# Patient Record
Sex: Female | Born: 1990 | Race: White | Hispanic: No | Marital: Married
Health system: Midwestern US, Community
[De-identification: ages and names within clinical notes are randomized; demographics above are authoritative.]

## PROBLEM LIST (undated history)

## (undated) DIAGNOSIS — F419 Anxiety disorder, unspecified: Secondary | ICD-10-CM

## (undated) DIAGNOSIS — E282 Polycystic ovarian syndrome: Secondary | ICD-10-CM

## (undated) DIAGNOSIS — E119 Type 2 diabetes mellitus without complications: Secondary | ICD-10-CM

## (undated) DIAGNOSIS — K219 Gastro-esophageal reflux disease without esophagitis: Secondary | ICD-10-CM

## (undated) DIAGNOSIS — K760 Fatty (change of) liver, not elsewhere classified: Secondary | ICD-10-CM

## (undated) DIAGNOSIS — R748 Abnormal levels of other serum enzymes: Secondary | ICD-10-CM

## (undated) HISTORY — DX: Gastro-esophageal reflux disease without esophagitis: K21.9

## (undated) HISTORY — DX: Anxiety disorder, unspecified: F41.9

## (undated) HISTORY — DX: Abnormal levels of other serum enzymes: R74.8

## (undated) HISTORY — DX: Fatty (change of) liver, not elsewhere classified: K76.0

## (undated) HISTORY — DX: Type 2 diabetes mellitus without complications: E11.9

## (undated) HISTORY — DX: Polycystic ovarian syndrome: E28.2

---

## 2013-02-28 ENCOUNTER — Ambulatory Visit: Payer: Self-pay | Admitting: Nurse Practitioner

## 2013-05-10 ENCOUNTER — Telehealth: Payer: Self-pay | Admitting: Family Medicine

## 2013-05-10 NOTE — Telephone Encounter (Signed)
Patient called requesting information on how to get water out of her ear. States that she bought some [employer] from [place]. Pt advised that she would try that first and then if not resolved would call back for appt

## 2013-05-29 ENCOUNTER — Ambulatory Visit (INDEPENDENT_AMBULATORY_CARE_PROVIDER_SITE_OTHER): Admitting: Physician Assistant

## 2013-05-29 ENCOUNTER — Encounter: Payer: Self-pay | Admitting: Physician Assistant

## 2013-05-29 VITALS — BP 117/73 | HR 102 | Temp 97.8°F | Ht 68.25 in | Wt 280.0 lb

## 2013-05-29 DIAGNOSIS — F411 Generalized anxiety disorder: Secondary | ICD-10-CM

## 2013-05-29 DIAGNOSIS — R1013 Epigastric pain: Secondary | ICD-10-CM

## 2013-05-29 MED ORDER — VENLAFAXINE HCL ER 75 MG PO CP24: 75.0000 mg | ORAL_CAPSULE | Freq: Every day | ORAL | Status: DC

## 2013-05-29 NOTE — Progress Notes (Signed)
Subjective:     Patient ID: Haley Norris, female   DOB: [birth year], 22 y.o.   MRN: [id number]  HPI Pt with a long hx (~14yrs) of abd pain and diarrhea Pt has had a full WU to include colonoscopy, endoscopy , and labs that were all nl She thinks sx may be due to her anxiety She was prev tried on Celexa but stopped due to gaining 60lbs She would like to try Effexor because both of her parents are on this and it has worked well Pt tired of cont abd sx despite changing diet and lifestyle Admits to being very anxious and states abd pain and diarrhea doe not help the situation  Review of Systems  All other systems reviewed and are negative.       Objective:   Physical Exam  Nursing note and vitals reviewed. Spent 15- 20 min with pt face to face Calm at appt Good eye contact and interaction during appt      Assessment:     Anxiety Chronic abd pain    Plan:     Trial of Effexor- start 37.5 daily for 1 week and then increase to 75mg  daily SE reviewed F/U in 3 weeks Cont to manage diet OTC Immodium for sx

## 2013-05-29 NOTE — Patient Instructions (Signed)

## 2013-06-19 ENCOUNTER — Ambulatory Visit: Admitting: Physician Assistant

## 2014-04-07 ENCOUNTER — Telehealth: Payer: Self-pay | Admitting: Nurse Practitioner

## 2014-04-07 NOTE — Telephone Encounter (Signed)
Patient stated that she hurt her shoulder at work. I notified patient that she needs to check with her employer to see if they are going to cover the visit and then give us a call back

## 2014-04-11 ENCOUNTER — Other Ambulatory Visit: Admitting: Obstetrics & Gynecology

## 2014-04-21 ENCOUNTER — Other Ambulatory Visit: Admitting: Obstetrics & Gynecology

## 2014-04-22 ENCOUNTER — Other Ambulatory Visit (HOSPITAL_COMMUNITY)
Admission: RE | Admit: 2014-04-22 | Discharge: 2014-04-22 | Disposition: A | Discharge status: Home / Self Care | Payer: Self-pay | Source: Ambulatory Visit | Attending: Obstetrics & Gynecology | Admitting: Obstetrics & Gynecology

## 2014-04-22 ENCOUNTER — Ambulatory Visit (INDEPENDENT_AMBULATORY_CARE_PROVIDER_SITE_OTHER): Admitting: Obstetrics & Gynecology

## 2014-04-22 ENCOUNTER — Encounter: Payer: Self-pay | Admitting: Obstetrics & Gynecology

## 2014-04-22 VITALS — BP 130/80 | Ht 69.0 in | Wt 284.0 lb

## 2014-04-22 DIAGNOSIS — Z01419 Encounter for gynecological examination (general) (routine) without abnormal findings: Secondary | ICD-10-CM

## 2014-04-22 MED ORDER — NORGESTIMATE-ETH ESTRADIOL 0.25-35 MG-MCG PO TABS: 1.0000 | ORAL_TABLET | Freq: Every day | ORAL | Status: DC

## 2014-04-22 NOTE — Addendum Note (Signed)
Addended by: Gaylyn RongEVANS, Greysen Swanton A on: 04/22/2014 03:10 PM   Modules accepted: Orders

## 2014-04-22 NOTE — Progress Notes (Signed)
Patient ID: Haley Norris, female   DOB: 10/22/1991, 23 y.o.   MRN: [id number] Subjective:     Haley Norris is a 23 y.o. female here for a routine exam.  Patient's last menstrual period was 04/10/2014. No obstetric history on file. Birth Control Method: ocp Menstrual Calendar(currently): regular  Current complaints: none.   Current acute medical issues:  PCOS, reviewed with pt   Recent Gynecologic History Patient's last menstrual period was 04/10/2014. Last Pap: never,   Last mammogram: ,    Past Medical History  Diagnosis Date  . PCOS (polycystic ovarian syndrome)   . Anxiety     History reviewed. No pertinent past surgical history.  OB History   Grav Para Term Preterm Abortions TAB SAB Ect Mult Living                  History   Social History  . Marital Status: Single    Spouse Name: N/A    Number of Children: N/A  . Years of Education: N/A   Social History Main Topics  . Smoking status: Never Smoker   . Smokeless tobacco: Never Used  . Alcohol Use: No  . Drug Use: No  . Sexual Activity: Yes   Other Topics Concern  . None   Social History Narrative  . None    Family History  Problem Relation Age of Onset  . Healthy Mother   . Healthy Father   . Healthy Brother      Review of Systems  Review of Systems  Constitutional: Negative for fever, chills, weight loss, malaise/fatigue and diaphoresis.  HENT: Negative for hearing loss, ear pain, nosebleeds, congestion, sore throat, neck pain, tinnitus and ear discharge.   Eyes: Negative for blurred vision, double vision, photophobia, pain, discharge and redness.  Respiratory: Negative for cough, hemoptysis, sputum production, shortness of breath, wheezing and stridor.   Cardiovascular: Negative for chest pain, palpitations, orthopnea, claudication, leg swelling and PND.  Gastrointestinal: negative for abdominal pain. Negative for heartburn, nausea, vomiting, diarrhea, constipation, blood in stool and  melena.  Genitourinary: Negative for dysuria, urgency, frequency, hematuria and flank pain.  Musculoskeletal: Negative for myalgias, back pain, joint pain and falls.  Skin: Negative for itching and rash.  Neurological: Negative for dizziness, tingling, tremors, sensory change, speech change, focal weakness, seizures, loss of consciousness, weakness and headaches.  Endo/Heme/Allergies: Negative for environmental allergies and polydipsia. Does not bruise/bleed easily.  Psychiatric/Behavioral: Negative for depression, suicidal ideas, hallucinations, memory loss and substance abuse. The patient is not nervous/anxious and does not have insomnia.        Objective:    Physical Exam  Vitals reviewed. Constitutional: She is oriented to person, place, and time. She appears well-developed and well-nourished.  HENT:  Head: Normocephalic and atraumatic.        Right Ear: External ear normal.  Left Ear: External ear normal.  Nose: Nose normal.  Mouth/Throat: Oropharynx is clear and moist.  Eyes: Conjunctivae and EOM are normal. Pupils are equal, round, and reactive to light. Right eye exhibits no discharge. Left eye exhibits no discharge. No scleral icterus.  Neck: Normal range of motion. Neck supple. No tracheal deviation present. No thyromegaly present.  Cardiovascular: Normal rate, regular rhythm, normal heart sounds and intact distal pulses.  Exam reveals no gallop and no friction rub.   No murmur heard. Respiratory: Effort normal and breath sounds normal. No respiratory distress. She has no wheezes. She has no rales. She exhibits no tenderness.  GI: Soft.  Bowel sounds are normal. She exhibits no distension and no mass. There is no tenderness. There is no rebound and no guarding.  Genitourinary:  Breasts no masses skin changes or nipple changes bilaterally      Vulva is normal without lesions Vagina is pink moist without discharge Cervix normal in appearance and pap is done Uterus is normal  size shape and contour Adnexa is negative with normal sized ovaries    Musculoskeletal: Normal range of motion. She exhibits no edema and no tenderness.  Neurological: She is alert and oriented to person, place, and time. She has normal reflexes. She displays normal reflexes. No cranial nerve deficit. She exhibits normal muscle tone. Coordination normal.  Skin: Skin is warm and dry. No rash noted. No erythema. No pallor.  Psychiatric: She has a normal mood and affect. Her behavior is normal. Judgment and thought content normal.       Assessment:    Healthy female exam.    Plan:    Contraception: OCP (estrogen/progesterone). Follow up in: 1 year.

## 2014-04-23 ENCOUNTER — Encounter: Payer: Self-pay | Admitting: Physician Assistant

## 2014-04-23 ENCOUNTER — Ambulatory Visit (INDEPENDENT_AMBULATORY_CARE_PROVIDER_SITE_OTHER): Admitting: Physician Assistant

## 2014-04-23 VITALS — BP 130/81 | HR 88 | Temp 99.3°F | Ht 69.0 in | Wt 283.0 lb

## 2014-04-23 DIAGNOSIS — K589 Irritable bowel syndrome without diarrhea: Secondary | ICD-10-CM

## 2014-04-23 NOTE — Progress Notes (Signed)
Subjective:     Patient ID: Haley PatrickJaden L Robertson, female   DOB: 04/14/1991, 23 y.o.   MRN: [id number]  HPI Pt here due to cont abd pain, early satiety, and diarrhea Prev seen by GI and had colonoscopy which she states was neg Attempt at visualizing the upper GI was unsuccessful since pt became combative Pt informed she had IBS but did not want to start any med due to the SE She would like to return back to the GI for re-eval  Review of Systems  Constitutional: Negative for unexpected weight change.  Gastrointestinal: Positive for nausea, abdominal pain, diarrhea and abdominal distention. Negative for vomiting, blood in stool and anal bleeding.       Objective:   Physical Exam  Vitals reviewed. Cardiovascular: Normal rate, regular rhythm, normal heart sounds and intact distal pulses.   Pulmonary/Chest: Effort normal and breath sounds normal.  Abdominal: Soft. Bowel sounds are normal. She exhibits no distension and no mass. There is no tenderness.       Assessment:     IBS    Plan:     Pt has large intake of diary product- mainly milk Would like her to decrease this and caff intake She is a non smoker and does not drink alcohol OTC Immodium for sx Will set up referral

## 2014-04-29 ENCOUNTER — Telehealth: Payer: Self-pay | Admitting: Physician Assistant

## 2014-10-15 ENCOUNTER — Ambulatory Visit: Admitting: Family Medicine

## 2014-12-12 ENCOUNTER — Encounter: Payer: Self-pay | Admitting: Family Medicine

## 2014-12-12 ENCOUNTER — Ambulatory Visit (INDEPENDENT_AMBULATORY_CARE_PROVIDER_SITE_OTHER): Payer: 59

## 2014-12-12 ENCOUNTER — Ambulatory Visit (INDEPENDENT_AMBULATORY_CARE_PROVIDER_SITE_OTHER): Payer: 59 | Admitting: Family Medicine

## 2014-12-12 ENCOUNTER — Encounter (INDEPENDENT_AMBULATORY_CARE_PROVIDER_SITE_OTHER): Payer: Self-pay

## 2014-12-12 VITALS — BP 148/84 | HR 124 | Temp 97.6°F | Ht 69.0 in | Wt 290.0 lb

## 2014-12-12 DIAGNOSIS — R1012 Left upper quadrant pain: Secondary | ICD-10-CM

## 2014-12-12 DIAGNOSIS — R5383 Other fatigue: Secondary | ICD-10-CM

## 2014-12-12 DIAGNOSIS — K2100 Gastro-esophageal reflux disease with esophagitis, without bleeding: Secondary | ICD-10-CM

## 2014-12-12 DIAGNOSIS — R197 Diarrhea, unspecified: Secondary | ICD-10-CM

## 2014-12-12 DIAGNOSIS — K5909 Other constipation: Secondary | ICD-10-CM

## 2014-12-12 LAB — POCT CBC
Granulocyte percent: 62.5 %G (ref 37–80)
HCT, POC: 46.9 % (ref 37.7–47.9)
Hemoglobin: 14.3 g/dL (ref 12.2–16.2)
Lymph, poc: 1.7 (ref 0.6–3.4)
MCH, POC: 25.1 pg — AB (ref 27–31.2)
MCHC: 30.5 g/dL — AB (ref 31.8–35.4)
MCV: 82.1 fL (ref 80–97)
MPV: 6.9 fL (ref 0–99.8)
POC Granulocyte: 3.5 (ref 2–6.9)
POC LYMPH PERCENT: 30.2 %L (ref 10–50)
Platelet Count, POC: 218 10*3/uL (ref 142–424)
RBC: 5.7 M/uL — AB (ref 4.04–5.48)
RDW, POC: 14.5 %
WBC: 5.6 10*3/uL (ref 4.6–10.2)

## 2014-12-12 MED ORDER — POLYETHYLENE GLYCOL 3350 17 GM/SCOOP PO POWD: 17.0000 g | Freq: Two times a day (BID) | ORAL | Status: DC | PRN

## 2014-12-12 MED ORDER — OMEPRAZOLE 20 MG PO CPDR: 20.0000 mg | DELAYED_RELEASE_CAPSULE | Freq: Two times a day (BID) | ORAL | Status: DC

## 2014-12-12 NOTE — Addendum Note (Signed)
Addended by: Deatra CanterXFORD, WILLIAM J on: 12/12/2014 04:36 PM   Modules accepted: Orders

## 2014-12-12 NOTE — Progress Notes (Signed)
Subjective:    Patient ID: Haley Norris, female    DOB: [birth year], 24 y.o.   MRN: [id number]  HPI Patient is c/o diarrhea and 5 bowel movements for last 4 years.  She has seen GI And was told she has IBS.  She states she has early satiety.  She has LUQ abdominal pain.  She has problems with GERD sx's which she describes as Severe.  She saw GI 3 or 4 years ago.  She states she eats very little but is still gaining weight.   Review of Systems  Constitutional: Negative for fever.  HENT: Negative for ear pain.   Eyes: Negative for discharge.  Respiratory: Negative for cough.   Cardiovascular: Negative for chest pain.  Gastrointestinal: Negative for abdominal distention.  Endocrine: Negative for polyuria.  Genitourinary: Negative for difficulty urinating.  Musculoskeletal: Negative for gait problem and neck pain.  Skin: Negative for color change and rash.  Neurological: Negative for speech difficulty and headaches.  Psychiatric/Behavioral: Negative for agitation.       Objective:    BP 148/84 mmHg  Pulse 124  Temp(Src) 97.6 F (36.4 C) (Oral)  Ht 5' 9" (1.753 m)  Wt 290 lb (131.543 kg)  BMI 42.81 kg/m2 Physical Exam  Constitutional: She is oriented to person, place, and time. She appears well-developed and well-nourished.  HENT:  Head: Normocephalic and atraumatic.  Mouth/Throat: Oropharynx is clear and moist.  Eyes: Pupils are equal, round, and reactive to light.  Neck: Normal range of motion. Neck supple.  Cardiovascular: Normal rate and regular rhythm.   No murmur heard. Pulmonary/Chest: Effort normal and breath sounds normal.  Abdominal: Soft. Bowel sounds are normal. There is tenderness.  TTP LUQ of abdomen  Neurological: She is alert and oriented to person, place, and time.  Skin: Skin is warm and dry.  Psychiatric: She has a normal mood and affect.          Assessment & Plan:     ICD-9-CM ICD-10-CM   1. Gastroesophageal reflux disease with esophagitis  530.11 K21.0 omeprazole (PRILOSEC) 20 MG capsule     Ambulatory referral to Gastroenterology  2. Left upper quadrant pain 789.02 R10.12 DG Abd 1 View     POCT CBC     CMP14+EGFR     Celiac Panel     Ambulatory referral to Gastroenterology  3. Diarrhea 787.91 R19.7 Vitamin B12     Celiac Panel     Ambulatory referral to Gastroenterology  4. Other fatigue 780.79 R53.83 Thyroid Panel With TSH     Vitamin B12     No Follow-up on file.  Lysbeth Penner FNP

## 2014-12-12 NOTE — Addendum Note (Signed)
Addended by: Prescott GumLAND, Robinette Esters M on: 12/12/2014 04:28 PM   Modules accepted: Orders

## 2014-12-12 NOTE — Addendum Note (Signed)
Addended by: Prescott GumLAND, Broughton Eppinger M on: 12/12/2014 04:29 PM   Modules accepted: Orders

## 2014-12-16 LAB — CMP14+EGFR
ALT: 62 IU/L — ABNORMAL HIGH (ref 0–32)
AST: 132 IU/L — ABNORMAL HIGH (ref 0–40)
Albumin/Globulin Ratio: 1.6 (ref 1.1–2.5)
Albumin: 4.5 g/dL (ref 3.5–5.5)
Alkaline Phosphatase: 80 IU/L (ref 39–117)
BUN/Creatinine Ratio: 12 (ref 8–20)
BUN: 7 mg/dL (ref 6–20)
CO2: 22 mmol/L (ref 18–29)
Calcium: 9.8 mg/dL (ref 8.7–10.2)
Chloride: 98 mmol/L (ref 97–108)
Creatinine, Ser: 0.59 mg/dL (ref 0.57–1.00)
GFR calc Af Amer: 152 mL/min/{1.73_m2} (ref >59)
GFR calc non Af Amer: 131 mL/min/{1.73_m2} (ref >59)
Globulin, Total: 2.8 g/dL (ref 1.5–4.5)
Glucose: 109 mg/dL — ABNORMAL HIGH (ref 65–99)
Potassium: 4.8 mmol/L (ref 3.5–5.2)
Sodium: 140 mmol/L (ref 134–144)
Total Bilirubin: <0.2 mg/dL (ref 0.0–1.2)
Total Protein: 7.3 g/dL (ref 6.0–8.5)

## 2014-12-16 LAB — THYROID PANEL WITH TSH
Free Thyroxine Index: <1.9 (ref 1.2–4.9)
T3 Uptake Ratio: <15 % — ABNORMAL LOW (ref 24–39)
T4, Total: 12.4 ug/dL — ABNORMAL HIGH (ref 4.5–12.0)
TSH: 3.53 u[IU]/mL (ref 0.450–4.500)

## 2014-12-16 LAB — CELIAC PANEL 10
Antigliadin Abs, IgA: 3 units (ref 0–19)
Endomysial IgA: NEGATIVE
Gliadin IgG: 2 units (ref 0–19)
IgA/Immunoglobulin A, Serum: 105 mg/dL (ref 91–414)
Tissue Transglut Ab: <2 U/mL (ref 0–5)
Transglutaminase IgA: <2 U/mL (ref 0–3)

## 2014-12-16 LAB — VITAMIN B12: Vitamin B-12: 463 pg/mL (ref 211–946)

## 2014-12-24 ENCOUNTER — Encounter (INDEPENDENT_AMBULATORY_CARE_PROVIDER_SITE_OTHER): Payer: Self-pay | Admitting: *Deleted

## 2015-01-01 ENCOUNTER — Ambulatory Visit: Payer: 59 | Admitting: Family Medicine

## 2015-01-22 ENCOUNTER — Ambulatory Visit (INDEPENDENT_AMBULATORY_CARE_PROVIDER_SITE_OTHER): Payer: 59 | Admitting: Internal Medicine

## 2015-01-22 ENCOUNTER — Encounter (INDEPENDENT_AMBULATORY_CARE_PROVIDER_SITE_OTHER): Payer: Self-pay | Admitting: *Deleted

## 2015-01-22 ENCOUNTER — Other Ambulatory Visit (INDEPENDENT_AMBULATORY_CARE_PROVIDER_SITE_OTHER): Payer: Self-pay | Admitting: *Deleted

## 2015-01-22 ENCOUNTER — Encounter (INDEPENDENT_AMBULATORY_CARE_PROVIDER_SITE_OTHER): Payer: Self-pay | Admitting: Internal Medicine

## 2015-01-22 VITALS — BP 112/82 | HR 116 | Temp 98.1°F | Ht 69.0 in | Wt 290.0 lb

## 2015-01-22 DIAGNOSIS — R1013 Epigastric pain: Secondary | ICD-10-CM

## 2015-01-22 DIAGNOSIS — R748 Abnormal levels of other serum enzymes: Secondary | ICD-10-CM

## 2015-01-22 DIAGNOSIS — K589 Irritable bowel syndrome without diarrhea: Secondary | ICD-10-CM

## 2015-01-22 DIAGNOSIS — R140 Abdominal distension (gaseous): Secondary | ICD-10-CM

## 2015-01-22 DIAGNOSIS — G8929 Other chronic pain: Secondary | ICD-10-CM

## 2015-01-22 LAB — HEPATIC FUNCTION PANEL
ALBUMIN: 4.4 g/dL (ref 3.5–5.2)
ALT: 95 U/L — ABNORMAL HIGH (ref 0–35)
AST: 128 U/L — AB (ref 0–37)
Alkaline Phosphatase: 98 U/L (ref 39–117)
Total Bilirubin: 0.3 mg/dL (ref 0.2–1.2)
Total Protein: 7.4 g/dL (ref 6.0–8.3)

## 2015-01-22 MED ORDER — DICYCLOMINE HCL 10 MG PO CAPS: 10.0000 mg | ORAL_CAPSULE | Freq: Three times a day (TID) | ORAL | Status: DC

## 2015-01-22 NOTE — Progress Notes (Addendum)
Subjective:    Patient ID: Haley SillsJaden Willetts, female    DOB: 07/13/1991, 24 y.o.   MRN: [id number]  HPI Referred by Dr. Purcell Nailsxford at [facility]. She tells me in the lat 1 1/2 years, she has epigastric pain.   fullness in her upper abdomen. She tells me she has acid reflux about 2 times a week and takes PPI on an as needed basis.  No dysphagia.   She has 5-6 loose stools a day. She has BMs almost immediately after eating.. She says greasy foods bother her. No fast foods in the past months. Her appetite is good. She does have early satiety but then becomes  hungry again quickly.  [place] symptoms which were normal. Patient cannot remember who she saw. EGD was not perfomed because she became combative. NO ASA 12/12/2014 AST 132, ALT 62.   12/08/2011 EGD Dr. Jeani HawkingPatrick Hung: Normal EGD. Biopsy: Mild peptic type changes. Gastric fovelolar metaplasia identified. No evidence of celiac disease  12/08/2011 Colonoscopy with Biopsy: Dr. Elnoria HowardHung: Normal colon. Medium hemorrhoids.  Diarrhea (watery). Epigastric pain, nausea,. Biopsy: unremarkable colonic mucosa. No evidence of lymphocytic or collagenous colitis.   CBC    Component Value Date/Time   WBC 5.6 12/12/2014 1642   RBC 5.7* 12/12/2014 1642   HGB 14.3 12/12/2014 1642   HCT 46.9 12/12/2014 1642   MCV 82.1 12/12/2014 1642   MCH 25.1* 12/12/2014 1642   MCHC 30.5* 12/12/2014 1642    CMP Latest Ref Rng 12/12/2014  Glucose 65 - 99 mg/dL 409(W109(H)  BUN 6 - 20 mg/dL 7  Creatinine 1.190.57 - 1.471.00 mg/dL 8.290.59  Sodium 562134 - 130144 mmol/L 140  Potassium 3.5 - 5.2 mmol/L 4.8  Chloride 97 - 108 mmol/L 98  CO2 18 - 29 mmol/L 22  Calcium 8.7 - 10.2 mg/dL 9.8  Total Protein 6.0 - 8.5 g/dL 7.3  Albumin 3.5 - 5.5 g/dL 4.5  Total Bilirubin 0.0 - 1.2 mg/dL <8.6<0.2  Alkaline Phos 39 - 117 IU/L 80  AST 0 - 40 IU/L 132(H)  ALT 0 - 32 IU/L 62(H)   .ts   Review of Systems Past Medical History  Diagnosis  Date  . PCOS (polycystic ovarian syndrome)   . Anxiety   . GERD (gastroesophageal reflux disease)     No past surgical history on file.  No Known Allergies  Current Outpatient Prescriptions on File Prior to Visit  Medication Sig Dispense Refill  . norgestimate-ethinyl estradiol (ORTHO-CYCLEN,SPRINTEC,PREVIFEM) 0.25-35 MG-MCG tablet Take 1 tablet by mouth daily. 1 Package 12  . omeprazole (PRILOSEC) 20 MG capsule Take 1 capsule (20 mg total) by mouth 2 (two) times daily before a meal. (Patient taking differently: Take 20 mg by mouth as needed. ) 60 capsule 6   No current facility-administered medications on file prior to visit.        Objective:   Physical Exam Pulse 116, B/P 112/82 Filed Vitals:   01/22/15 1005  Height: 5\' 9"  (1.753 m)  Weight: 290 lb (131.543 kg)     Filed Vitals:   01/22/15 1005  Height: 5\' 9"  (1.753 m)  Weight: 290 lb (131.543 kg)    Filed Vitals:   01/22/15 1005  Height: 5\' 9"  (1.753 m)  Weight: 290 lb (131.543 kg)   Alert and oriented. Skin warm and dry. Oral mucosa is moist.   . Sclera anicteric, conjunctivae is pink. Thyroid not enlarged. No cervical lymphadenopathy. Lungs clear. Heart regular  rate and rhythm.  Abdomen is soft. Bowel sounds are positive. No hepatomegaly. No abdominal masses felt. She has epigastric tenderness.  No edema to lower extremities.  Morbidly obese        Assessment & Plan:   Epigastric tenderness: PUD needs to be ruled out. Prilosec  BID. EGD.The risks and benefits such as perforation, bleeding, and infection were reviewed with the patient and is agreeable. Dicyclomine  TID for possible IBS. I have asked patient and her mother to try and find out who did her colonoscopy.  Elevated liver enzymes: am going to repeat a hepatic function on her.

## 2015-01-22 NOTE — Patient Instructions (Signed)
EGD. The risks and benefits such as perforation, bleeding, and infection were reviewed with the patient and is agreeable. 

## 2015-01-23 ENCOUNTER — Other Ambulatory Visit (INDEPENDENT_AMBULATORY_CARE_PROVIDER_SITE_OTHER): Payer: Self-pay | Admitting: Internal Medicine

## 2015-01-23 ENCOUNTER — Telehealth (INDEPENDENT_AMBULATORY_CARE_PROVIDER_SITE_OTHER): Payer: Self-pay | Admitting: Internal Medicine

## 2015-01-23 ENCOUNTER — Encounter (INDEPENDENT_AMBULATORY_CARE_PROVIDER_SITE_OTHER): Payer: Self-pay | Admitting: Internal Medicine

## 2015-01-23 DIAGNOSIS — R748 Abnormal levels of other serum enzymes: Secondary | ICD-10-CM

## 2015-01-23 NOTE — Telephone Encounter (Signed)
Am going to order labs to rule out autoimmune process

## 2015-01-27 LAB — HEPATITIS C ANTIBODY: HCV AB: NEGATIVE

## 2015-01-27 LAB — SEDIMENTATION RATE: SED RATE: 34 mm/h — AB (ref 0–20)

## 2015-01-27 LAB — FERRITIN: Ferritin: 114 ng/mL (ref 10–291)

## 2015-01-27 LAB — ANTI-SMOOTH MUSCLE ANTIBODY, IGG: SMOOTH MUSCLE AB: 2 U (ref <20)

## 2015-01-28 ENCOUNTER — Ambulatory Visit (HOSPITAL_COMMUNITY): Payer: 59

## 2015-01-28 LAB — MITOCHONDRIAL ANTIBODIES: Mitochondrial M2 Ab, IgG: 0.45 (ref <0.91)

## 2015-01-28 LAB — ANA: Anti Nuclear Antibody(ANA): NEGATIVE

## 2015-02-02 ENCOUNTER — Ambulatory Visit (HOSPITAL_COMMUNITY)
Admission: RE | Admit: 2015-02-02 | Discharge: 2015-02-02 | Disposition: A | Discharge status: Home / Self Care | Payer: 59 | Source: Ambulatory Visit | Attending: Internal Medicine | Admitting: Internal Medicine

## 2015-02-02 DIAGNOSIS — R748 Abnormal levels of other serum enzymes: Secondary | ICD-10-CM

## 2015-02-04 ENCOUNTER — Encounter (INDEPENDENT_AMBULATORY_CARE_PROVIDER_SITE_OTHER): Payer: Self-pay | Admitting: *Deleted

## 2015-02-04 ENCOUNTER — Telehealth (INDEPENDENT_AMBULATORY_CARE_PROVIDER_SITE_OTHER): Payer: Self-pay | Admitting: *Deleted

## 2015-02-04 DIAGNOSIS — K589 Irritable bowel syndrome without diarrhea: Secondary | ICD-10-CM

## 2015-02-04 DIAGNOSIS — R1013 Epigastric pain: Secondary | ICD-10-CM

## 2015-02-04 NOTE — Telephone Encounter (Signed)
.  Per Delrae Renderri Setzer,NP patient to have Hepatic Profile in 4 weeks.

## 2015-02-04 NOTE — Telephone Encounter (Signed)
Lab is noted for 03/04/15. A letter was sent to patient as a reminder.

## 2015-02-04 NOTE — Telephone Encounter (Signed)
Tammy, hepatic function in 4 weeks.  Lupita LeashDonna, OV in 6 weeks.

## 2015-02-04 NOTE — Telephone Encounter (Signed)
Patient would like results of US, please call her home#

## 2015-02-06 NOTE — Telephone Encounter (Signed)
Apt has been scheduled for 03/19/15 with Dorene Arerri Setzer, NP.

## 2015-02-12 ENCOUNTER — Encounter: Admission: RE | Payer: Self-pay | Source: Ambulatory Visit

## 2015-02-12 ENCOUNTER — Ambulatory Visit: Admission: RE | Admit: 2015-02-12 | Payer: 59 | Source: Ambulatory Visit | Admitting: Internal Medicine

## 2015-02-12 SURGERY — EGD (ESOPHAGOGASTRODUODENOSCOPY)
Anesthesia: Moderate Sedation

## 2015-02-24 ENCOUNTER — Encounter (INDEPENDENT_AMBULATORY_CARE_PROVIDER_SITE_OTHER): Payer: Self-pay

## 2015-03-06 LAB — HEPATIC FUNCTION PANEL
ALT: 104 U/L — ABNORMAL HIGH (ref 0–35)
AST: 204 U/L — ABNORMAL HIGH (ref 0–37)
Albumin: 4.4 g/dL (ref 3.5–5.2)
Alkaline Phosphatase: 96 U/L (ref 39–117)
Bilirubin, Direct: 0.1 mg/dL (ref 0.0–0.3)
Indirect Bilirubin: 0.2 mg/dL (ref 0.2–1.2)
TOTAL PROTEIN: 7.5 g/dL (ref 6.0–8.3)
Total Bilirubin: 0.3 mg/dL (ref 0.2–1.2)

## 2015-03-19 ENCOUNTER — Ambulatory Visit (INDEPENDENT_AMBULATORY_CARE_PROVIDER_SITE_OTHER): Payer: 59 | Admitting: Internal Medicine

## 2015-03-19 ENCOUNTER — Telehealth (INDEPENDENT_AMBULATORY_CARE_PROVIDER_SITE_OTHER): Payer: Self-pay | Admitting: *Deleted

## 2015-03-19 ENCOUNTER — Telehealth (INDEPENDENT_AMBULATORY_CARE_PROVIDER_SITE_OTHER): Payer: Self-pay | Admitting: Internal Medicine

## 2015-03-19 DIAGNOSIS — R748 Abnormal levels of other serum enzymes: Secondary | ICD-10-CM

## 2015-03-19 DIAGNOSIS — K589 Irritable bowel syndrome without diarrhea: Secondary | ICD-10-CM

## 2015-03-19 NOTE — Telephone Encounter (Signed)
.  Per Delrae Renderri Setzer,NP patient to have las.

## 2015-03-19 NOTE — Telephone Encounter (Signed)
OV today 

## 2015-05-06 ENCOUNTER — Encounter (INDEPENDENT_AMBULATORY_CARE_PROVIDER_SITE_OTHER): Payer: Self-pay | Admitting: *Deleted

## 2015-05-12 ENCOUNTER — Ambulatory Visit (INDEPENDENT_AMBULATORY_CARE_PROVIDER_SITE_OTHER): Payer: 59 | Admitting: Internal Medicine

## 2015-05-18 ENCOUNTER — Telehealth (INDEPENDENT_AMBULATORY_CARE_PROVIDER_SITE_OTHER): Payer: Self-pay | Admitting: Internal Medicine

## 2015-05-18 ENCOUNTER — Encounter (INDEPENDENT_AMBULATORY_CARE_PROVIDER_SITE_OTHER): Payer: Self-pay | Admitting: Internal Medicine

## 2015-05-18 ENCOUNTER — Telehealth (INDEPENDENT_AMBULATORY_CARE_PROVIDER_SITE_OTHER): Payer: Self-pay | Admitting: *Deleted

## 2015-05-18 ENCOUNTER — Ambulatory Visit (INDEPENDENT_AMBULATORY_CARE_PROVIDER_SITE_OTHER): Payer: 59 | Admitting: Internal Medicine

## 2015-05-18 VITALS — BP 130/84 | HR 68 | Temp 97.7°F | Ht 69.0 in | Wt 290.9 lb

## 2015-05-18 DIAGNOSIS — R748 Abnormal levels of other serum enzymes: Secondary | ICD-10-CM

## 2015-05-18 NOTE — Progress Notes (Addendum)
   Subjective:    Patient ID: Haley Norris, female    DOB: 07-08-1991, 24 y.o.   MRN: [id number]  HPI  Here today for f/u of her elevated transaminase. She was last seen in February of this year. She has missed a few appts. Appetite is good. No weight loss. No abdominal pain. She usually has a BM after each meal and most of her stools are loose.  She is not exercising. 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 Mother is present in room.  BC since 2012 for poly cystic ovary disease Weight in February 290. Today her weight 290.9 She has received the Hep B vaccine in high school.    Hepatic Function Latest Ref Rng 03/05/2015 01/22/2015 12/12/2014  Total Protein 6.0 - 8.3 g/dL 7.5 7.4 7.3  Albumin 3.5 - 5.2 g/dL 4.4 4.4 -  AST 0 - 37 U/L 204(H) 128(H) 132(H)  ALT 0 - 35 U/L 104(H) 95(H) 62(H)  Alk Phosphatase 39 - 117 U/L 96 98 80  Total Bilirubin 0.2 - 1.2 mg/dL 0.3 0.3 <0.2  Bilirubin, Direct 0.0 - 0.3 mg/dL 0.1 <0.1 -      02/02/2015 US abdomen: IMPRESSION: There is no acute hepatobiliary abnormality. There is mild enlargement of the liver and spleen which may be related to the patient's body habitus. In addition increased hepatic echotexture is consistent with fatty infiltration.   12/2014 vitamin B12 463 01/26/2015 Mitochondrial antibody: normal. Ferritin 114, Vitamin B12 463, Hep C antibody negative, ANA negative, SMA 2, sedrate 44   12/08/2011 EGD Dr. Carol Ada: Normal EGD. Biopsy: Mild peptic type changes. Gastric fovelolar metaplasia identified. No evidence of celiac disease  12/08/2011 Colonoscopy with Biopsy: Dr. Benson Norway: Normal colon. Medium hemorrhoids.   Diarrhea (watery). Epigastric pain, nausea,. Biopsy: unremarkable colonic mucosa. No evidence of lymphocytic or collagenous colitis.      Review of Systems Past Medical History  Diagnosis Date  . PCOS (polycystic ovarian syndrome)   . Anxiety   . GERD (gastroesophageal reflux disease)   . Fatty liver   . Elevated liver  enzymes     No past surgical history on file.  No Known Allergies  Current Outpatient Prescriptions on File Prior to Visit  Medication Sig Dispense Refill  . norgestimate-ethinyl estradiol (ORTHO-CYCLEN,SPRINTEC,PREVIFEM) 0.25-35 MG-MCG tablet Take 1 tablet by mouth daily. 1 Package 12  . omeprazole (PRILOSEC) 20 MG capsule Take 1 capsule (20 mg total) by mouth 2 (two) times daily before a meal. 60 capsule 6   No current facility-administered medications on file prior to visit.  Married, no children,.      Objective:   Physical Exam  Blood pressure 130/84, pulse 68, temperature 97.7 F (36.5 C), height $RemoveBe'5\' 9"'nsFBLKzlm$  (1.753 m), weight 290 lb 14.4 oz (131.951 kg).  Alert and oriented. Skin warm and dry. Oral mucosa is moist.   . Sclera anicteric, conjunctivae is pink. Thyroid not enlarged. No cervical lymphadenopathy. Lungs clear. Heart regular rate and rhythm.  Abdomen is soft. Bowel sounds are positive. No hepatomegaly. No abdominal masses felt. No tenderness.  No edema to lower extremities.  Extremely obese.      Assessment & Plan:  Elevated liver enzymes, Fatty liver. Patient must diet and exercise. Ceruloplasmin, CBC with diff, Hepatic function, Alpha 1 antitrypsin. At some point patient may need a liver biopsy. I will discuss with Dr. Laural Golden first. OV in 2 months

## 2015-05-18 NOTE — Telephone Encounter (Signed)
Patient has OV today

## 2015-05-18 NOTE — Telephone Encounter (Signed)
.  Per Delrae Rend patient will need this lab.

## 2015-05-18 NOTE — Patient Instructions (Signed)
Labs today. OV in 6 weeks. 

## 2015-05-19 LAB — HEPATIC FUNCTION PANEL
ALBUMIN: 4.4 g/dL (ref 3.5–5.2)
ALK PHOS: 92 U/L (ref 39–117)
ALT: 79 U/L — ABNORMAL HIGH (ref 0–35)
AST: 142 U/L — AB (ref 0–37)
BILIRUBIN INDIRECT: 0.2 mg/dL (ref 0.2–1.2)
Bilirubin, Direct: 0.1 mg/dL (ref 0.0–0.3)
Total Bilirubin: 0.3 mg/dL (ref 0.2–1.2)
Total Protein: 7.2 g/dL (ref 6.0–8.3)

## 2015-05-19 LAB — CBC WITH DIFFERENTIAL/PLATELET
BASOS ABS: 0 10*3/uL (ref 0.0–0.1)
Basophils Relative: 0 % (ref 0–1)
EOS ABS: 0.2 10*3/uL (ref 0.0–0.7)
EOS PCT: 1 % (ref 0–5)
HCT: 37.2 % (ref 36.0–46.0)
HEMOGLOBIN: 11.9 g/dL — AB (ref 12.0–15.0)
LYMPHS ABS: 4.6 10*3/uL — AB (ref 0.7–4.0)
Lymphocytes Relative: 27 % (ref 12–46)
MCH: 26.2 pg (ref 26.0–34.0)
MCHC: 32 g/dL (ref 30.0–36.0)
MCV: 81.8 fL (ref 78.0–100.0)
MPV: 9.4 fL (ref 8.6–12.4)
Monocytes Absolute: 0.9 10*3/uL (ref 0.1–1.0)
Monocytes Relative: 5 % (ref 3–12)
Neutro Abs: 11.4 10*3/uL — ABNORMAL HIGH (ref 1.7–7.7)
Neutrophils Relative %: 67 % (ref 43–77)
PLATELETS: 563 10*3/uL — AB (ref 150–400)
RBC: 4.55 MIL/uL (ref 3.87–5.11)
RDW: 15.1 % (ref 11.5–15.5)
WBC: 17 10*3/uL — ABNORMAL HIGH (ref 4.0–10.5)

## 2015-05-20 LAB — ALPHA-1-ANTITRYPSIN: A-1 Antitrypsin, Ser: 242 mg/dL — ABNORMAL HIGH (ref 83–199)

## 2015-05-20 LAB — CERULOPLASMIN: Ceruloplasmin: 50 mg/dL (ref 18–53)

## 2015-05-21 ENCOUNTER — Telehealth (INDEPENDENT_AMBULATORY_CARE_PROVIDER_SITE_OTHER): Payer: Self-pay | Admitting: Internal Medicine

## 2015-05-21 DIAGNOSIS — R748 Abnormal levels of other serum enzymes: Secondary | ICD-10-CM

## 2015-05-21 NOTE — Telephone Encounter (Signed)
Patient will have labs drawn tomorrow.

## 2015-05-22 ENCOUNTER — Telehealth (INDEPENDENT_AMBULATORY_CARE_PROVIDER_SITE_OTHER): Payer: Self-pay | Admitting: Internal Medicine

## 2015-05-22 DIAGNOSIS — D72829 Elevated white blood cell count, unspecified: Secondary | ICD-10-CM

## 2015-05-22 NOTE — Telephone Encounter (Signed)
CBC ordered 

## 2015-06-25 ENCOUNTER — Encounter (INDEPENDENT_AMBULATORY_CARE_PROVIDER_SITE_OTHER): Payer: Self-pay | Admitting: *Deleted

## 2015-07-14 ENCOUNTER — Ambulatory Visit (INDEPENDENT_AMBULATORY_CARE_PROVIDER_SITE_OTHER): Payer: 59 | Admitting: Internal Medicine

## 2015-07-14 ENCOUNTER — Encounter (INDEPENDENT_AMBULATORY_CARE_PROVIDER_SITE_OTHER): Payer: Self-pay | Admitting: Internal Medicine

## 2015-07-14 ENCOUNTER — Telehealth (INDEPENDENT_AMBULATORY_CARE_PROVIDER_SITE_OTHER): Payer: Self-pay | Admitting: *Deleted

## 2015-07-14 VITALS — BP 132/80 | HR 80 | Temp 98.1°F | Ht 69.0 in | Wt 276.1 lb

## 2015-07-14 DIAGNOSIS — R7989 Other specified abnormal findings of blood chemistry: Secondary | ICD-10-CM

## 2015-07-14 DIAGNOSIS — R945 Abnormal results of liver function studies: Principal | ICD-10-CM

## 2015-07-14 DIAGNOSIS — R748 Abnormal levels of other serum enzymes: Secondary | ICD-10-CM

## 2015-07-14 NOTE — Patient Instructions (Signed)
Labs. OV in 6 months. Diet and exercise.  

## 2015-07-14 NOTE — Progress Notes (Signed)
Subjective:    Patient ID: Haley Norris, female    DOB: [birth year], 24 y.o.   MRN: [id number]  HPI Here today for f/u. She was last seen in June for elevated liver enzymes. She has received Hepatitis B vaccine.  She has been on [facility] since 2012 for poly cystic ovary disease.  Her appetite is good. She has changed her eating habits. She has lost 13 pounds since her last visit which was intentional. No acid reflux. Controlled with Omeprazole.  She is working part time as a Lawyer. Her BMs are normal. No melena or BRRB.  Hepatic Function Panel     Component Value Date/Time   PROT 7.2 05/18/2015 1351   PROT 7.3 12/12/2014 1630   ALBUMIN 4.4 05/18/2015 1351   AST 142* 05/18/2015 1351   ALT 79* 05/18/2015 1351   ALKPHOS 92 05/18/2015 1351   BILITOT 0.3 05/18/2015 1351   BILIDIR 0.1 05/18/2015 1351   IBILI 0.2 05/18/2015 1351        Hepatic Function Latest Ref Rng 03/05/2015 01/22/2015 12/12/2014  Total Protein 6.0 - 8.3 g/dL 7.5 7.4 7.3  Albumin 3.5 - 5.2 g/dL 4.4 4.4 -  AST 0 - 37 U/L 204(H) 128(H) 132(H)  ALT 0 - 35 U/L 104(H) 95(H) 62(H)  Alk Phosphatase 39 - 117 U/L 96 98 80  Total Bilirubin 0.2 - 1.2 mg/dL 0.3 0.3 <5.2  Bilirubin, Direct 0.0 - 0.3 mg/dL 0.1 <5.1 -      08/22/6377 US abdomen: IMPRESSION: There is no acute hepatobiliary abnormality. There is mild enlargement of the liver and spleen which may be related to the patient's body habitus. In addition increased hepatic echotexture is consistent with fatty infiltration.  12/2014 vitamin B12 463 01/26/2015 Mitochondrial antibody: normal. Ferritin 114, Vitamin B12 463, Hep C antibody negative, ANA negative, SMA 2, sedrate 44   12/08/2011 EGD Dr. Jeani Hawking: Normal EGD. Biopsy: Mild peptic type changes. Gastric fovelolar metaplasia identified. No evidence of celiac disease  12/08/2011 Colonoscopy with Biopsy: Dr. Elnoria Howard: Normal colon. Medium hemorrhoids.  Diarrhea (watery).  Epigastric pain, nausea,. Biopsy: unremarkable colonic mucosa. No evidence of lymphocytic or collagenous colitis.            Review of Systems Past Medical History  Diagnosis Date  . PCOS (polycystic ovarian syndrome)   . Anxiety   . GERD (gastroesophageal reflux disease)   . Fatty liver   . Elevated liver enzymes     No past surgical history on file.  No Known Allergies  Current Outpatient Prescriptions on File Prior to Visit  Medication Sig Dispense Refill  . norgestimate-ethinyl estradiol (ORTHO-CYCLEN,SPRINTEC,PREVIFEM) 0.25-35 MG-MCG tablet Take 1 tablet by mouth daily. 1 Package 12  . omeprazole (PRILOSEC) 20 MG capsule Take 1 capsule (20 mg total) by mouth 2 (two) times daily before a meal. 60 capsule 6   No current facility-administered medications on file prior to visit.        Objective:   Physical ExamBlood pressure 132/80, pulse 80, temperature 98.1 F (36.7 C), height 5\' 9"  (1.753 m), weight 276 lb 1.6 oz (125.238 kg). Alert and oriented. Skin warm and dry. Oral mucosa is moist.   . Sclera anicteric, conjunctivae is pink. Thyroid not enlarged. No cervical lymphadenopathy. Lungs clear. Heart regular rate and rhythm.  Abdomen is soft. Bowel sounds are positive. No hepatomegaly. No abdominal masses felt. No tenderness.  No edema to lower extremities.          Assessment & Plan:  Elevated liver enzymes.  Hepatic function, Sedrate , Alpha 1 antitrypsin, Hep B antibody, Hep B surface antigen, CBC. OV in 6 months

## 2015-07-14 NOTE — Telephone Encounter (Signed)
.  Per [name] Setzer,NP patient to have lab. 

## 2015-07-15 LAB — CBC WITH DIFFERENTIAL/PLATELET
Basophils Absolute: 0 10*3/uL (ref 0.0–0.1)
Basophils Relative: 0 % (ref 0–1)
EOS ABS: 0.1 10*3/uL (ref 0.0–0.7)
Eosinophils Relative: 1 % (ref 0–5)
HCT: 36.9 % (ref 36.0–46.0)
Hemoglobin: 12 g/dL (ref 12.0–15.0)
LYMPHS ABS: 4.9 10*3/uL — AB (ref 0.7–4.0)
Lymphocytes Relative: 36 % (ref 12–46)
MCH: 25.5 pg — ABNORMAL LOW (ref 26.0–34.0)
MCHC: 32.5 g/dL (ref 30.0–36.0)
MCV: 78.5 fL (ref 78.0–100.0)
MPV: 10.1 fL (ref 8.6–12.4)
Monocytes Absolute: 0.7 10*3/uL (ref 0.1–1.0)
Monocytes Relative: 5 % (ref 3–12)
NEUTROS PCT: 58 % (ref 43–77)
Neutro Abs: 7.9 10*3/uL — ABNORMAL HIGH (ref 1.7–7.7)
Platelets: 548 10*3/uL — ABNORMAL HIGH (ref 150–400)
RBC: 4.7 MIL/uL (ref 3.87–5.11)
RDW: 14.8 % (ref 11.5–15.5)
WBC: 13.7 10*3/uL — ABNORMAL HIGH (ref 4.0–10.5)

## 2015-07-15 LAB — HEPATIC FUNCTION PANEL
ALBUMIN: 4.4 g/dL (ref 3.6–5.1)
ALT: 53 U/L — ABNORMAL HIGH (ref 6–29)
AST: 62 U/L — ABNORMAL HIGH (ref 10–30)
Alkaline Phosphatase: 70 U/L (ref 33–115)
BILIRUBIN DIRECT: 0.1 mg/dL (ref <=0.2)
Indirect Bilirubin: 0.1 mg/dL — ABNORMAL LOW (ref 0.2–1.2)
Total Bilirubin: 0.2 mg/dL (ref 0.2–1.2)
Total Protein: 7.2 g/dL (ref 6.1–8.1)

## 2015-07-15 LAB — HEPATITIS B SURFACE ANTIGEN: Hepatitis B Surface Ag: NEGATIVE

## 2015-07-15 LAB — SEDIMENTATION RATE: Sed Rate: 38 mm/hr — ABNORMAL HIGH (ref 0–20)

## 2015-07-15 LAB — HEPATITIS B CORE ANTIBODY, IGM: HEP B C IGM: NONREACTIVE

## 2015-07-16 LAB — ALPHA-1-ANTITRYPSIN: A1 ANTITRYPSIN SER: 254 mg/dL — AB (ref 83–199)

## 2015-07-20 ENCOUNTER — Ambulatory Visit (INDEPENDENT_AMBULATORY_CARE_PROVIDER_SITE_OTHER): Payer: 59 | Admitting: Internal Medicine

## 2015-07-22 ENCOUNTER — Telehealth (INDEPENDENT_AMBULATORY_CARE_PROVIDER_SITE_OTHER): Payer: Self-pay | Admitting: *Deleted

## 2015-07-22 NOTE — Telephone Encounter (Signed)
Haley Norris would like for [name] to please call her back at [phone]. She would like to discuss her lab results.

## 2015-07-24 NOTE — Telephone Encounter (Signed)
I have talked with patient concerning her lab work.

## 2015-07-30 ENCOUNTER — Telehealth (INDEPENDENT_AMBULATORY_CARE_PROVIDER_SITE_OTHER): Payer: Self-pay | Admitting: Internal Medicine

## 2015-07-30 ENCOUNTER — Telehealth (INDEPENDENT_AMBULATORY_CARE_PROVIDER_SITE_OTHER): Payer: Self-pay | Admitting: *Deleted

## 2015-07-30 DIAGNOSIS — R748 Abnormal levels of other serum enzymes: Secondary | ICD-10-CM

## 2015-07-30 NOTE — Telephone Encounter (Signed)
LM with mother for Rachal to return the call.

## 2015-07-30 NOTE — Telephone Encounter (Signed)
.  Per Delrae Rend patient will have labs in 3 months.

## 2015-07-30 NOTE — Telephone Encounter (Signed)
Labs are noted for 3 months.

## 2015-07-30 NOTE — Telephone Encounter (Signed)
I spoke with patient's mother.  for me: May need a liver biopsy due to elevated platelet. Will repeat a CBC and hepatic function. She has been dieting. Liver numbers are coming down.  Lupita Leash, OV in 3 months.    Tammy, CBC and Hepatic function in 3 months.

## 2015-08-04 NOTE — Telephone Encounter (Signed)
Spoke with Remer and she will need to call me back.

## 2015-09-22 ENCOUNTER — Encounter (INDEPENDENT_AMBULATORY_CARE_PROVIDER_SITE_OTHER): Payer: Self-pay | Admitting: *Deleted

## 2015-09-22 ENCOUNTER — Telehealth (INDEPENDENT_AMBULATORY_CARE_PROVIDER_SITE_OTHER): Payer: Self-pay | Admitting: *Deleted

## 2015-09-22 NOTE — Telephone Encounter (Signed)
NO answer on cell and LM on home number.  Apt has been scheduled for 10/05/15 with Dorene Arerri Setzer, NP and F/U letter has been mailed.

## 2015-09-22 NOTE — Telephone Encounter (Signed)
noted 

## 2015-09-22 NOTE — Telephone Encounter (Signed)
Haley DodgeJaden called to Cx her apt. She is being seen by another doctor.

## 2015-10-01 ENCOUNTER — Other Ambulatory Visit (INDEPENDENT_AMBULATORY_CARE_PROVIDER_SITE_OTHER): Payer: Self-pay | Admitting: *Deleted

## 2015-10-01 ENCOUNTER — Encounter (INDEPENDENT_AMBULATORY_CARE_PROVIDER_SITE_OTHER): Payer: Self-pay | Admitting: *Deleted

## 2015-10-01 DIAGNOSIS — R748 Abnormal levels of other serum enzymes: Secondary | ICD-10-CM

## 2015-10-05 ENCOUNTER — Ambulatory Visit (INDEPENDENT_AMBULATORY_CARE_PROVIDER_SITE_OTHER): Payer: 59 | Admitting: Internal Medicine

## 2015-12-25 ENCOUNTER — Other Ambulatory Visit: Payer: Self-pay | Admitting: Family Medicine

## 2016-02-03 ENCOUNTER — Encounter: Payer: Self-pay | Admitting: Family Medicine

## 2016-02-03 ENCOUNTER — Ambulatory Visit (INDEPENDENT_AMBULATORY_CARE_PROVIDER_SITE_OTHER): Payer: Managed Care, Other (non HMO) | Admitting: Family Medicine

## 2016-02-03 VITALS — BP 132/88 | HR 114 | Temp 99.5°F | Ht 69.0 in | Wt 254.6 lb

## 2016-02-03 DIAGNOSIS — J029 Acute pharyngitis, unspecified: Secondary | ICD-10-CM

## 2016-02-03 DIAGNOSIS — R059 Cough, unspecified: Secondary | ICD-10-CM

## 2016-02-03 DIAGNOSIS — R05 Cough: Secondary | ICD-10-CM

## 2016-02-03 DIAGNOSIS — R0989 Other specified symptoms and signs involving the circulatory and respiratory systems: Secondary | ICD-10-CM

## 2016-02-03 MED ORDER — FLUTICASONE PROPIONATE 50 MCG/ACT NA SUSP: 1.0000 | Freq: Two times a day (BID) | NASAL | Status: DC | PRN

## 2016-02-03 NOTE — Progress Notes (Signed)
BP 132/88 mmHg  Pulse 114  Temp(Src) 99.5 F (37.5 C) (Oral)  Ht  (1.753 m)  Wt 254 lb 9.6 oz (115.486 kg)  BMI 37.58 kg/m2  LMP 02/03/2016 (Exact Date)   Subjective:    Patient ID: Haley Norris, female    DOB: Dec 09, 1990, 25 y.o.   MRN: [id number]  HPI: Haley Norris is a 25 y.o. female presenting on 02/03/2016 for Bronchitis; Sinusitis; and Neck Pain   HPI Cough cold and sinus congestion and chest congestion Patient has been having cough and chest congestion and sinus congestion for the past 2 days. She has been using some Alka-Seltzer over-the-counter without much success. She says she usually gets this about this time each year. She denies any fevers or chills besides the low-grade that she has today of 99.5. She denies any sick contacts that she knows of. She has been feeling a little achy but no true fevers at home. She denies any shortness of breath or wheezing.  Relevant past medical, surgical, family and social history reviewed and updated as indicated. Interim medical history since our last visit reviewed. Allergies and medications reviewed and updated.  Review of Systems  Constitutional: Negative for fever and chills.  HENT: Positive for congestion, postnasal drip, rhinorrhea, sinus pressure and sore throat. Negative for ear discharge, ear pain and sneezing.   Eyes: Negative for pain, redness and visual disturbance.  Respiratory: Positive for cough. Negative for chest tightness and shortness of breath.   Cardiovascular: Negative for chest pain and leg swelling.  Genitourinary: Negative for dysuria and difficulty urinating.  Musculoskeletal: Negative for back pain and gait problem.  Skin: Negative for rash.  Neurological: Negative for light-headedness and headaches.  Psychiatric/Behavioral: Negative for behavioral problems and agitation.  All other systems reviewed and are negative.   Per HPI unless specifically indicated above     Medication List       This  list is accurate as of: 02/03/16  3:42 PM.  Always use your most recent med list.               metFORMIN 500 MG tablet  Commonly known as:  GLUCOPHAGE  Take by mouth daily with breakfast.     norgestimate-ethinyl estradiol 0.25-35 MG-MCG tablet  Commonly known as:  ORTHO-CYCLEN,SPRINTEC,PREVIFEM  Take 1 tablet by mouth daily.     omeprazole 20 MG capsule  Commonly known as:  PRILOSEC  Take 1 capsule (20 mg total) by mouth 2 (two) times daily before a meal.           Objective:    BP 132/88 mmHg  Pulse 114  Temp(Src) 99.5 F (37.5 C) (Oral)  Ht  (1.753 m)  Wt 254 lb 9.6 oz (115.486 kg)  BMI 37.58 kg/m2  LMP 02/03/2016 (Exact Date)  Wt Readings from Last 3 Encounters:  02/03/16 254 lb 9.6 oz (115.486 kg)  07/14/15 276 lb 1.6 oz (125.238 kg)  05/18/15 290 lb 14.4 oz (131.951 kg)    Physical Exam  Constitutional: She is oriented to person, place, and time. She appears well-developed and well-nourished. No distress.  HENT:  Right Ear: Tympanic membrane, external ear and ear canal normal.  Left Ear: Tympanic membrane, external ear and ear canal normal.  Nose: Mucosal edema and rhinorrhea present. No epistaxis. Right sinus exhibits no maxillary sinus tenderness and no frontal sinus tenderness. Left sinus exhibits no maxillary sinus tenderness and no frontal sinus tenderness.  Mouth/Throat: Uvula is midline and mucous membranes are normal.  Posterior oropharyngeal edema and posterior oropharyngeal erythema present. No oropharyngeal exudate or tonsillar abscesses.  Eyes: Conjunctivae and EOM are normal.  Neck: Neck supple. No thyromegaly present.  Cardiovascular: Normal rate, regular rhythm, normal heart sounds and intact distal pulses.   No murmur heard. Pulmonary/Chest: Effort normal and breath sounds normal. No respiratory distress. She has no wheezes. She has no rales.  Musculoskeletal: Normal range of motion. She exhibits no edema or tenderness.  Lymphadenopathy:     She has no cervical adenopathy.  Neurological: She is alert and oriented to person, place, and time. Coordination normal.  Skin: Skin is warm and dry. No rash noted. She is not diaphoretic.  Psychiatric: She has a normal mood and affect. Her behavior is normal.  Vitals reviewed.  Influenza A and B: Negative     Assessment & Plan:   Problem List Items Addressed This Visit    None    Visit Diagnoses    Chest congestion    -  Primary    Relevant Medications    fluticasone (FLONASE) 50 MCG/ACT nasal spray    Other Relevant Orders    POCT Influenza A/B    Cough        Relevant Medications    fluticasone (FLONASE) 50 MCG/ACT nasal spray    Other Relevant Orders    POCT Influenza A/B    Acute pharyngitis, unspecified etiology        Flu negative, try Flonase, antihistamine, Mucinex, call back in 3-4 days if not improved by then or if worsens.    Relevant Medications    fluticasone (FLONASE) 50 MCG/ACT nasal spray       Follow up plan: Return if symptoms worsen or fail to improve.  Counseling provided for all of the vaccine components Orders Placed This Encounter  Procedures  . POCT Influenza A/B    Arville Care, MD [facility] 02/03/2016, 3:42 PM

## 2016-05-10 ENCOUNTER — Ambulatory Visit (INDEPENDENT_AMBULATORY_CARE_PROVIDER_SITE_OTHER): Payer: Managed Care, Other (non HMO) | Admitting: Family Medicine

## 2016-05-10 ENCOUNTER — Encounter: Payer: Self-pay | Admitting: Family Medicine

## 2016-05-10 VITALS — BP 122/76 | HR 104 | Temp 98.0°F | Ht 69.0 in | Wt 253.6 lb

## 2016-05-10 DIAGNOSIS — E282 Polycystic ovarian syndrome: Secondary | ICD-10-CM | POA: Diagnosis not present

## 2016-05-10 DIAGNOSIS — N912 Amenorrhea, unspecified: Secondary | ICD-10-CM

## 2016-05-10 NOTE — Progress Notes (Signed)
Subjective:  Patient ID: Haley Norris, female    DOB: 02/19/1991  Age: 25 y.o. MRN: [id number]  CC: Amenorrhea   HPI Haley SillsJaden Norris presents for LMP 03/27/2016. DCed contraception in mid February. Denies nausea. Appetite nml. Hx of PCOS. Has OB appt. Set for 6/13. Wants blood test for increased accuracy due to PCOS.   History Haley Norris has a past medical history of PCOS (polycystic ovarian syndrome); Anxiety; GERD (gastroesophageal reflux disease); Fatty liver; and Elevated liver enzymes.   She has no past surgical history on file.   Her family history includes Healthy in her brother, father, and mother.She reports that she has never smoked. She has never used smokeless tobacco. She reports that she does not drink alcohol or use illicit drugs.    ROS Review of Systems  Constitutional: Negative for activity change and appetite change.  Gastrointestinal: Negative for nausea.  Genitourinary: Negative for vaginal bleeding, vaginal discharge, menstrual problem and pelvic pain.   As per HPI Objective:  BP 122/76 mmHg  Pulse 104  Temp(Src) 98 F (36.7 C) (Oral)  Ht 5\' 9"  (1.753 m)  Wt 253 lb 9.6 oz (115.032 kg)  BMI 37.43 kg/m2  SpO2 100%  LMP 03/27/2016  BP Readings from Last 3 Encounters:  05/10/16 122/76  02/03/16 132/88  07/14/15 132/80    Wt Readings from Last 3 Encounters:  05/10/16 253 lb 9.6 oz (115.032 kg)  02/03/16 254 lb 9.6 oz (115.486 kg)  07/14/15 276 lb 1.6 oz (125.238 kg)     Physical Exam  Constitutional: She is oriented to person, place, and time. She appears well-developed and well-nourished.  HENT:  Head: Normocephalic.  Eyes: EOM are normal. Pupils are equal, round, and reactive to light.  Cardiovascular: Normal rate and regular rhythm.   No murmur heard. Pulmonary/Chest: Breath sounds normal.  Neurological: She is alert and oriented to person, place, and time.  Psychiatric: She has a normal mood and affect.     Lab Results  Component Value Date    WBC 13.7* 07/14/2015   HGB 12.0 07/14/2015   HCT 36.9 07/14/2015   PLT 548* 07/14/2015   GLUCOSE 109* 12/12/2014   ALT 53* 07/14/2015   AST 62* 07/14/2015   NA 140 12/12/2014   K 4.8 12/12/2014   CL 98 12/12/2014   CREATININE 0.59 12/12/2014   BUN 7 12/12/2014   CO2 22 12/12/2014   TSH 3.530 12/12/2014    [place] Abdomen Complete  02/02/2015  CLINICAL DATA:  Elevated liver enzymes, initial visit. History of morbid obesity. EXAM: ULTRASOUND ABDOMEN COMPLETE COMPARISON:  None. FINDINGS: Gallbladder: No gallstones or wall thickening visualized. No sonographic Murphy sign noted. Common bile duct: Diameter: 2.9 mm Liver: The liver is of large enlarged measuring 24 cm in the midclavicular line. The echotexture is increased diffusely. There is no focal mass or ductal dilation. IVC: The observed portions are unremarkable. Pancreas: The observed portions are unremarkable. Spleen: The splenic volume is increased at 582 cc. Right Kidney: Length: 15.2 cm. Echogenicity within normal limits. No mass or hydronephrosis visualized. Left Kidney: Length: 13.5 cm. Echogenicity within normal limits. No mass or hydronephrosis visualized. Abdominal aorta: No abdominal aortic aneurysm is demonstrated. Other findings: No ascites is demonstrated. IMPRESSION: There is no acute hepatobiliary abnormality. There is mild enlargement of the liver and spleen which may be related to the patient's body habitus. In addition increased hepatic echotexture is consistent with fatty infiltration. Electronically Signed   By: David  Swazil[place]   On: 02/02/2015 08:17  Assessment & Plan:   Azalea was seen today for amenorrhea.  Diagnoses and all orders for this visit:  Amenorrhea -     Beta HCG, Quant  PCOS (polycystic ovarian syndrome) -     Beta HCG, Quant      I have discontinued Ms. Arther's norgestimate-ethinyl estradiol, omeprazole, and fluticasone. I am also having her maintain her metFORMIN.  No orders of the defined  types were placed in this encounter.     Follow-up: Return if symptoms worsen or fail to improve.  Mechele Claude, M.D.

## 2016-05-11 ENCOUNTER — Telehealth: Payer: Self-pay | Admitting: Family Medicine

## 2016-05-11 LAB — BETA HCG QUANT (REF LAB): HCG QUANT: 12509 m[IU]/mL

## 2016-05-11 NOTE — Telephone Encounter (Signed)
Patient is requesting her lab results

## 2016-05-17 ENCOUNTER — Other Ambulatory Visit: Payer: Self-pay | Admitting: Obstetrics and Gynecology

## 2016-05-17 LAB — OB RESULTS CONSOLE ANTIBODY SCREEN: ANTIBODY SCREEN: NEGATIVE

## 2016-05-17 LAB — OB RESULTS CONSOLE GC/CHLAMYDIA
Chlamydia: NEGATIVE
Gonorrhea: NEGATIVE

## 2016-05-17 LAB — OB RESULTS CONSOLE ABO/RH: RH TYPE: POSITIVE

## 2016-05-17 LAB — OB RESULTS CONSOLE HIV ANTIBODY (ROUTINE TESTING): HIV: NONREACTIVE

## 2016-05-17 LAB — OB RESULTS CONSOLE RPR: RPR: NONREACTIVE

## 2016-05-17 LAB — OB RESULTS CONSOLE RUBELLA ANTIBODY, IGM: RUBELLA: IMMUNE

## 2016-05-17 LAB — OB RESULTS CONSOLE HEPATITIS B SURFACE ANTIGEN: Hepatitis B Surface Ag: NEGATIVE

## 2016-05-19 LAB — CYTOLOGY - PAP

## 2016-08-31 ENCOUNTER — Ambulatory Visit (INDEPENDENT_AMBULATORY_CARE_PROVIDER_SITE_OTHER): Payer: Managed Care, Other (non HMO) | Admitting: Physician Assistant

## 2016-08-31 ENCOUNTER — Encounter: Payer: Self-pay | Admitting: Physician Assistant

## 2016-08-31 VITALS — BP 111/75 | HR 109 | Temp 97.8°F | Ht 69.0 in | Wt 251.6 lb

## 2016-08-31 DIAGNOSIS — H6982 Other specified disorders of Eustachian tube, left ear: Secondary | ICD-10-CM | POA: Diagnosis not present

## 2016-08-31 NOTE — Progress Notes (Signed)
BP 111/75   Pulse (!) 109   Temp 97.8 F (36.6 C) (Oral)   Ht 5\' 9"  (1.753 m)   Wt 251 lb 9.6 oz (114.1 kg)   LMP 03/27/2016   BMI 37.15 kg/m    Subjective:    Patient ID: Haley Norris, female    DOB: 10-30-1991, 25 y.o.   MRN: [id number]  HPI: Haley Norris is a 25 y.o. female presenting on 08/31/2016 for Ear Pain (Left ear pain that radiates down to tonsil ) This patient is [redacted] weeks pregnant. She has had off and on Herscher in her left ear over recent weeks. She denies any significant sinus drainage or allergy problems at this time. She is medication free except for her vitamins. She denies any fevers or chills at this time.  Relevant past medical, surgical, family and social history reviewed and updated as indicated. Interim medical history since our last visit reviewed. Allergies and medications reviewed and updated. DATA REVIEWED: CHART IN EPIC  Social History   Social History  . Marital status: Married    Spouse name: N/A  . Number of children: N/A  . Years of education: N/A   Occupational History  . Not on file.   Social History Main Topics  . Smoking status: Never Smoker  . Smokeless tobacco: Never Used  . Alcohol use No  . Drug use: No  . Sexual activity: Yes   Other Topics Concern  . Not on file   Social History Narrative  . No narrative on file    History reviewed. No pertinent surgical history.  Family History  Problem Relation Age of Onset  . Healthy Mother   . Healthy Father   . Healthy Brother     Review of Systems  Constitutional: Negative.   HENT: Positive for ear pain and postnasal drip. Negative for ear discharge, facial swelling, hearing loss, sinus pressure, sneezing, sore throat and tinnitus.   Eyes: Negative.   Respiratory: Negative.   Gastrointestinal: Negative.   Genitourinary: Negative.       Medication List       Accurate as of 08/31/16  3:01 PM. Always use your most recent med list.          metFORMIN 500 MG  tablet Commonly known as:  GLUCOPHAGE Take by mouth daily with breakfast.   PRENATAL 1 PO Take by mouth.          Objective:    BP 111/75   Pulse (!) 109   Temp 97.8 F (36.6 C) (Oral)   Ht 5\' 9"  (1.753 m)   Wt 251 lb 9.6 oz (114.1 kg)   LMP 03/27/2016   BMI 37.15 kg/m   No Known Allergies  Wt Readings from Last 3 Encounters:  08/31/16 251 lb 9.6 oz (114.1 kg)  05/10/16 253 lb 9.6 oz (115 kg)  02/03/16 254 lb 9.6 oz (115.5 kg)    Physical Exam  Constitutional: She is oriented to person, place, and time. She appears well-developed and well-nourished.  HENT:  Head: Normocephalic and atraumatic.  Right Ear: Hearing, tympanic membrane, external ear and ear canal normal.  Left Ear: Hearing, external ear and ear canal normal. No mastoid tenderness. Tympanic membrane is not injected and not erythematous. A middle ear effusion is present.  Eyes: Conjunctivae and EOM are normal. Pupils are equal, round, and reactive to light.  Cardiovascular: Normal rate, regular rhythm, normal heart sounds and intact distal pulses.   Pulmonary/Chest: Effort normal and breath sounds normal.  Abdominal: Soft. Bowel sounds are normal.  Neurological: She is alert and oriented to person, place, and time. She has normal reflexes.  Skin: Skin is warm and dry. No rash noted.  Psychiatric: She has a normal mood and affect. Her behavior is normal. Judgment and thought content normal.  Nursing note and vitals reviewed.   Results for orders placed or performed in visit on 05/17/16  Cytology - PAP  Result Value Ref Range   CYTOLOGY - PAP PAP RESULT       Assessment & Plan:   1. Eustachian tube dysfunction, left Saline nasal spray frequently, Nettie pot for irrigation, add Flonase if not improved.  Continue all other maintenance medications as listed above.  Follow up plan: Return if symptoms worsen or fail to improve.  Educational handout given for eustachian tube dysfunction  Remus LofflerAngel S. Basha Krygier  PA-C [facility] [address]  [place], [place] [address] [phone]   08/31/2016, 3:01 PM

## 2016-08-31 NOTE — Patient Instructions (Signed)

## 2016-11-04 ENCOUNTER — Inpatient Hospital Stay (HOSPITAL_COMMUNITY): Admission: AD | Admit: 2016-11-04 | Payer: Self-pay | Source: Ambulatory Visit | Admitting: Obstetrics and Gynecology

## 2016-11-30 ENCOUNTER — Other Ambulatory Visit: Payer: Self-pay | Admitting: Obstetrics and Gynecology

## 2016-11-30 LAB — OB RESULTS CONSOLE GBS: GBS: NEGATIVE

## 2016-12-05 NOTE — L&D Delivery Note (Signed)
Patient was C/C/+3 and pushed for 15 minutes with epidural.   NSVD  female infant, Apgars 8,9, weight P.   Nuchal cord was reduced and a 55 sec shoulder dystocia was relieved with suprapubic pressure and woods screw. The patient had a second degree midline laceration and first degree R labial one repaired with 2-0 vicryl R and 3-0 vicryl R. Fundus was firm. EBL was expected amount. Placenta was delivered intact. Vagina was clear.  Baby was vigorous and doing skin to skin with mother.  Ezekial Arns A

## 2016-12-15 ENCOUNTER — Other Ambulatory Visit: Payer: Self-pay | Admitting: Obstetrics and Gynecology

## 2016-12-20 ENCOUNTER — Telehealth (HOSPITAL_COMMUNITY): Payer: Self-pay | Admitting: *Deleted

## 2016-12-20 ENCOUNTER — Encounter (HOSPITAL_COMMUNITY): Payer: Self-pay | Admitting: *Deleted

## 2016-12-20 NOTE — Telephone Encounter (Signed)
Preadmission screen  

## 2016-12-26 ENCOUNTER — Other Ambulatory Visit: Payer: Self-pay | Admitting: Obstetrics and Gynecology

## 2016-12-27 ENCOUNTER — Encounter (HOSPITAL_COMMUNITY): Payer: Self-pay

## 2016-12-27 ENCOUNTER — Inpatient Hospital Stay (HOSPITAL_COMMUNITY)
Admission: RE | Admit: 2016-12-27 | Discharge: 2016-12-29 | DRG: 774 | Disposition: A | Discharge status: Home / Self Care | Payer: 59 | Source: Ambulatory Visit | Attending: Obstetrics and Gynecology | Admitting: Obstetrics and Gynecology

## 2016-12-27 ENCOUNTER — Inpatient Hospital Stay (HOSPITAL_COMMUNITY): Payer: 59 | Admitting: Anesthesiology

## 2016-12-27 DIAGNOSIS — Z6841 Body Mass Index (BMI) 40.0 and over, adult: Secondary | ICD-10-CM

## 2016-12-27 DIAGNOSIS — K219 Gastro-esophageal reflux disease without esophagitis: Secondary | ICD-10-CM | POA: Diagnosis present

## 2016-12-27 DIAGNOSIS — O2412 Pre-existing diabetes mellitus, type 2, in childbirth: Principal | ICD-10-CM | POA: Diagnosis present

## 2016-12-27 DIAGNOSIS — O99214 Obesity complicating childbirth: Secondary | ICD-10-CM | POA: Diagnosis present

## 2016-12-27 DIAGNOSIS — O24419 Gestational diabetes mellitus in pregnancy, unspecified control: Secondary | ICD-10-CM | POA: Diagnosis present

## 2016-12-27 DIAGNOSIS — Z7984 Long term (current) use of oral hypoglycemic drugs: Secondary | ICD-10-CM

## 2016-12-27 DIAGNOSIS — O9962 Diseases of the digestive system complicating childbirth: Secondary | ICD-10-CM | POA: Diagnosis present

## 2016-12-27 DIAGNOSIS — E282 Polycystic ovarian syndrome: Secondary | ICD-10-CM | POA: Diagnosis present

## 2016-12-27 DIAGNOSIS — Z3A39 39 weeks gestation of pregnancy: Secondary | ICD-10-CM

## 2016-12-27 DIAGNOSIS — E119 Type 2 diabetes mellitus without complications: Secondary | ICD-10-CM | POA: Diagnosis present

## 2016-12-27 LAB — GLUCOSE, CAPILLARY
GLUCOSE-CAPILLARY: 99 mg/dL (ref 65–99)
GLUCOSE-CAPILLARY: 83 mg/dL (ref 65–99)
GLUCOSE-CAPILLARY: 85 mg/dL (ref 65–99)
GLUCOSE-CAPILLARY: 77 mg/dL (ref 65–99)
Glucose-Capillary: 135 mg/dL — ABNORMAL HIGH (ref 65–99)

## 2016-12-27 LAB — CBC
HCT: 34.9 % — ABNORMAL LOW (ref 36.0–46.0)
HEMOGLOBIN: 11.5 g/dL — AB (ref 12.0–15.0)
MCH: 26.7 pg (ref 26.0–34.0)
MCHC: 33 g/dL (ref 30.0–36.0)
MCV: 81.2 fL (ref 78.0–100.0)
Platelets: 394 10*3/uL (ref 150–400)
RBC: 4.3 MIL/uL (ref 3.87–5.11)
RDW: 15.2 % (ref 11.5–15.5)
WBC: 13.2 10*3/uL — ABNORMAL HIGH (ref 4.0–10.5)

## 2016-12-27 LAB — TYPE AND SCREEN
ABO/RH(D): B POS
Antibody Screen: NEGATIVE

## 2016-12-27 LAB — RPR: RPR Ser Ql: NONREACTIVE

## 2016-12-27 LAB — ABO/RH: ABO/RH(D): B POS

## 2016-12-27 MED ORDER — ONDANSETRON HCL 4 MG PO TABS: 4.0000 mg | ORAL_TABLET | ORAL | Status: DC | PRN

## 2016-12-27 MED ORDER — LACTATED RINGERS IV SOLN: 500.0000 mL | Freq: Once | INTRAVENOUS | Status: DC

## 2016-12-27 MED ORDER — COCONUT OIL OIL
1.0000 | TOPICAL_OIL | Status: DC | PRN
Start: 2016-12-27 — End: 2016-12-29

## 2016-12-27 MED ORDER — BUTORPHANOL TARTRATE 1 MG/ML IJ SOLN: 1.0000 mg | INTRAMUSCULAR | Status: DC | PRN

## 2016-12-27 MED ORDER — MAGNESIUM HYDROXIDE 400 MG/5ML PO SUSP: 30.0000 mL | ORAL | Status: DC | PRN

## 2016-12-27 MED ORDER — WITCH HAZEL-GLYCERIN EX PADS: 1.0000 "application " | MEDICATED_PAD | CUTANEOUS | Status: DC | PRN

## 2016-12-27 MED ORDER — FENTANYL 2.5 MCG/ML BUPIVACAINE 1/10 % EPIDURAL INFUSION (WH - ANES)
14.0000 mL/h | INTRAMUSCULAR | Status: DC | PRN
  Administered 2016-12-27 (×2): 14 mL/h via EPIDURAL
  Filled 2016-12-27 (×2): qty 100

## 2016-12-27 MED ORDER — PHENYLEPHRINE 40 MCG/ML (10ML) SYRINGE FOR IV PUSH (FOR BLOOD PRESSURE SUPPORT)
80.0000 ug | PREFILLED_SYRINGE | INTRAVENOUS | Status: DC | PRN
  Filled 2016-12-27: qty 5

## 2016-12-27 MED ORDER — OXYTOCIN 40 UNITS IN LACTATED RINGERS INFUSION - SIMPLE MED: 2.5000 [IU]/h | INTRAVENOUS | Status: DC

## 2016-12-27 MED ORDER — OXYCODONE-ACETAMINOPHEN 5-325 MG PO TABS: 1.0000 | ORAL_TABLET | ORAL | Status: DC | PRN

## 2016-12-27 MED ORDER — OXYCODONE-ACETAMINOPHEN 5-325 MG PO TABS: 2.0000 | ORAL_TABLET | ORAL | Status: DC | PRN

## 2016-12-27 MED ORDER — SODIUM CHLORIDE 0.9 % IV SOLN: 250.0000 mL | INTRAVENOUS | Status: DC | PRN

## 2016-12-27 MED ORDER — SIMETHICONE 80 MG PO CHEW: 80.0000 mg | CHEWABLE_TABLET | ORAL | Status: DC | PRN

## 2016-12-27 MED ORDER — ACETAMINOPHEN 325 MG PO TABS
650.0000 mg | ORAL_TABLET | ORAL | Status: DC | PRN
  Administered 2016-12-28 – 2016-12-29 (×2): 650 mg via ORAL
  Filled 2016-12-27 (×2): qty 2

## 2016-12-27 MED ORDER — TETANUS-DIPHTH-ACELL PERTUSSIS 5-2.5-18.5 LF-MCG/0.5 IM SUSP: 0.5000 mL | Freq: Once | INTRAMUSCULAR | Status: DC

## 2016-12-27 MED ORDER — PRENATAL MULTIVITAMIN CH
1.0000 | ORAL_TABLET | Freq: Every day | ORAL | Status: DC
  Administered 2016-12-28: 1 via ORAL
  Filled 2016-12-27: qty 1

## 2016-12-27 MED ORDER — OXYTOCIN BOLUS FROM INFUSION
500.0000 mL | Freq: Once | INTRAVENOUS | Status: AC
  Administered 2016-12-27: 500 mL via INTRAVENOUS

## 2016-12-27 MED ORDER — OXYTOCIN 40 UNITS IN LACTATED RINGERS INFUSION - SIMPLE MED
1.0000 m[IU]/min | INTRAVENOUS | Status: DC
Start: 2016-12-27 — End: 2016-12-27
  Administered 2016-12-27: 2 m[IU]/min via INTRAVENOUS
  Filled 2016-12-27: qty 1000

## 2016-12-27 MED ORDER — METHYLERGONOVINE MALEATE 0.2 MG PO TABS: 0.2000 mg | ORAL_TABLET | ORAL | Status: DC | PRN

## 2016-12-27 MED ORDER — METFORMIN HCL 500 MG PO TABS
1000.0000 mg | ORAL_TABLET | Freq: Two times a day (BID) | ORAL | Status: DC
  Administered 2016-12-28 – 2016-12-29 (×3): 1000 mg via ORAL
  Filled 2016-12-27 (×3): qty 2

## 2016-12-27 MED ORDER — LACTATED RINGERS IV SOLN
INTRAVENOUS | Status: DC
  Administered 2016-12-27 (×4): via INTRAVENOUS

## 2016-12-27 MED ORDER — LACTATED RINGERS IV SOLN
500.0000 mL | INTRAVENOUS | Status: DC | PRN
  Administered 2016-12-27: 500 mL via INTRAVENOUS
  Administered 2016-12-27: 400 mL via INTRAVENOUS

## 2016-12-27 MED ORDER — LIDOCAINE HCL (PF) 1 % IJ SOLN
30.0000 mL | INTRAMUSCULAR | Status: DC | PRN
  Filled 2016-12-27: qty 30

## 2016-12-27 MED ORDER — BENZOCAINE-MENTHOL 20-0.5 % EX AERO: 1.0000 "application " | INHALATION_SPRAY | CUTANEOUS | Status: DC | PRN

## 2016-12-27 MED ORDER — EPHEDRINE 5 MG/ML INJ
10.0000 mg | INTRAVENOUS | Status: DC | PRN
  Filled 2016-12-27: qty 4

## 2016-12-27 MED ORDER — ONDANSETRON HCL 4 MG/2ML IJ SOLN: 4.0000 mg | INTRAMUSCULAR | Status: DC | PRN

## 2016-12-27 MED ORDER — IBUPROFEN 800 MG PO TABS
800.0000 mg | ORAL_TABLET | Freq: Three times a day (TID) | ORAL | Status: DC
  Administered 2016-12-27 – 2016-12-29 (×4): 800 mg via ORAL
  Filled 2016-12-27 (×4): qty 1

## 2016-12-27 MED ORDER — METHYLERGONOVINE MALEATE 0.2 MG/ML IJ SOLN: 0.2000 mg | INTRAMUSCULAR | Status: DC | PRN

## 2016-12-27 MED ORDER — ZOLPIDEM TARTRATE 5 MG PO TABS
5.0000 mg | ORAL_TABLET | Freq: Every evening | ORAL | Status: DC | PRN
Start: 2016-12-27 — End: 2016-12-29

## 2016-12-27 MED ORDER — ONDANSETRON HCL 4 MG/2ML IJ SOLN: 4.0000 mg | Freq: Four times a day (QID) | INTRAMUSCULAR | Status: DC | PRN

## 2016-12-27 MED ORDER — SOD CITRATE-CITRIC ACID 500-334 MG/5ML PO SOLN: 30.0000 mL | ORAL | Status: DC | PRN

## 2016-12-27 MED ORDER — MEASLES, MUMPS & RUBELLA VAC ~~LOC~~ INJ: 0.5000 mL | INJECTION | Freq: Once | SUBCUTANEOUS | Status: DC

## 2016-12-27 MED ORDER — TERBUTALINE SULFATE 1 MG/ML IJ SOLN
0.2500 mg | Freq: Once | INTRAMUSCULAR | Status: DC | PRN
  Filled 2016-12-27: qty 1

## 2016-12-27 MED ORDER — SENNOSIDES-DOCUSATE SODIUM 8.6-50 MG PO TABS
2.0000 | ORAL_TABLET | ORAL | Status: DC
  Administered 2016-12-27: 2 via ORAL
  Filled 2016-12-27: qty 2

## 2016-12-27 MED ORDER — DIPHENHYDRAMINE HCL 50 MG/ML IJ SOLN: 12.5000 mg | INTRAMUSCULAR | Status: DC | PRN

## 2016-12-27 MED ORDER — FERROUS SULFATE 325 (65 FE) MG PO TABS
325.0000 mg | ORAL_TABLET | Freq: Two times a day (BID) | ORAL | Status: DC
  Administered 2016-12-28 – 2016-12-29 (×3): 325 mg via ORAL
  Filled 2016-12-27 (×3): qty 1

## 2016-12-27 MED ORDER — DIPHENHYDRAMINE HCL 25 MG PO CAPS
25.0000 mg | ORAL_CAPSULE | Freq: Four times a day (QID) | ORAL | Status: DC | PRN
Start: 2016-12-27 — End: 2016-12-29

## 2016-12-27 MED ORDER — ACETAMINOPHEN 325 MG PO TABS: 650.0000 mg | ORAL_TABLET | ORAL | Status: DC | PRN

## 2016-12-27 MED ORDER — LIDOCAINE HCL (PF) 1 % IJ SOLN
INTRAMUSCULAR | Status: DC | PRN
Start: 2016-12-27 — End: 2016-12-27
  Administered 2016-12-27: 5 mL via EPIDURAL
  Administered 2016-12-27: 7 mL via EPIDURAL

## 2016-12-27 MED ORDER — MISOPROSTOL 25 MCG QUARTER TABLET
25.0000 ug | ORAL_TABLET | ORAL | Status: DC | PRN
  Administered 2016-12-27: 25 ug via VAGINAL
  Filled 2016-12-27: qty 1
  Filled 2016-12-27: qty 0.25

## 2016-12-27 MED ORDER — DIBUCAINE 1 % RE OINT: 1.0000 "application " | TOPICAL_OINTMENT | RECTAL | Status: DC | PRN

## 2016-12-27 MED ORDER — SODIUM CHLORIDE 0.9% FLUSH: 3.0000 mL | INTRAVENOUS | Status: DC | PRN

## 2016-12-27 MED ORDER — SODIUM CHLORIDE 0.9% FLUSH: 3.0000 mL | Freq: Two times a day (BID) | INTRAVENOUS | Status: DC

## 2016-12-27 MED ORDER — PHENYLEPHRINE 40 MCG/ML (10ML) SYRINGE FOR IV PUSH (FOR BLOOD PRESSURE SUPPORT)
80.0000 ug | PREFILLED_SYRINGE | INTRAVENOUS | Status: DC | PRN
  Filled 2016-12-27: qty 5
  Filled 2016-12-27: qty 10

## 2016-12-27 NOTE — Progress Notes (Signed)
POCT CBG was delayed due to pt eating at the time it was due, will check when pt has been fasting for at least two hours

## 2016-12-27 NOTE — Anesthesia Preprocedure Evaluation (Signed)
Anesthesia Evaluation  Patient identified by MRN, date of birth, ID band Patient awake    Reviewed: Allergy & Precautions, H&P , NPO status , Patient's Chart, lab work & pertinent test results  Airway Mallampati: II  TM Distance: >3 FB Neck ROM: full    Dental no notable dental hx.    Pulmonary neg pulmonary ROS,    Pulmonary exam normal        Cardiovascular negative cardio ROS Normal cardiovascular exam     Neuro/Psych negative neurological ROS     GI/Hepatic Neg liver ROS,   Endo/Other  diabetesMorbid obesity  Renal/GU negative Renal ROS     Musculoskeletal negative musculoskeletal ROS (+)   Abdominal (+) + obese,   Peds  Hematology negative hematology ROS (+)   Anesthesia Other Findings   Reproductive/Obstetrics (+) Pregnancy                             Anesthesia Physical Anesthesia Plan  ASA: III  Anesthesia Plan: Epidural   Post-op Pain Management:    Induction:   Airway Management Planned:   Additional Equipment:   Intra-op Plan:   Post-operative Plan:   Informed Consent: I have reviewed the patients History and Physical, chart, labs and discussed the procedure including the risks, benefits and alternatives for the proposed anesthesia with the patient or authorized representative who has indicated his/her understanding and acceptance.     Plan Discussed with:   Anesthesia Plan Comments:         Anesthesia Quick Evaluation

## 2016-12-27 NOTE — Anesthesia Pain Management Evaluation Note (Signed)
  CRNA Pain Management Visit Note  Patient: Haley SillsJaden Mclennan, 26 y.o., female  "Hello I am a [facility] of the anesthesia team at [facility]. We have an anesthesia team available at all times to provide care throughout the hospital, including epidural management and anesthesia for C-section. I don't know your plan for the delivery whether it a natural birth, water birth, IV sedation, nitrous supplementation, doula or epidural, but we want to meet your pain goals."   1.Was your pain managed to your expectations on prior hospitalizations?   No prior hospitalizations  2.What is your expectation for pain management during this hospitalization?     Epidural, IV pain meds and Nitrous Oxide  3.How can we help you reach that goal? Be available  Record the patient's initial score and the patient's pain goal.   Pain: 3  Pain Goal: 8 [facility] wants you to be able to say your pain was always managed very well.  Edison PaceWILKERSON,Caroll Cunnington 12/27/2016

## 2016-12-27 NOTE — H&P (Signed)
26 y.o. 5965w2d  G1P0 comes in for IOL for Class B DM.  Pt has been on metformin since before pregnancy and did not have to change dose during.  Otherwise has good fetal movement and no bleeding.  Past Medical History:  Diagnosis Date  . Anxiety   . Diabetes mellitus without complication (HCC)   . Elevated liver enzymes   . Fatty liver   . GERD (gastroesophageal reflux disease)   . PCOS (polycystic ovarian syndrome)    History reviewed. No pertinent surgical history.  OB History  Gravida Para Term Preterm AB Living  1            SAB TAB Ectopic Multiple Live Births               # Outcome Date GA Lbr Len/2nd Weight Sex Delivery Anes PTL Lv  1 Current               Social History   Social History  . Marital status: Married    Spouse name: N/A  . Number of children: N/A  . Years of education: N/A   Occupational History  . Not on file.   Social History Main Topics  . Smoking status: Never Smoker  . Smokeless tobacco: Never Used  . Alcohol use No  . Drug use: No  . Sexual activity: Yes   Other Topics Concern  . Not on file   Social History Narrative  . No narrative on file   Patient has no known allergies.    Prenatal Transfer Tool  Maternal Diabetes: Yes:  Diabetes Type:  Pre-pregnancy- metformin only Genetic Screening: Normal Maternal Ultrasounds/Referrals: Normal Fetal Ultrasounds or other Referrals:  None Maternal Substance Abuse:  No Significant Maternal Medications:  Meds include: Other: metformin Significant Maternal Lab Results: Lab values include: Group B Strep negative  Other PNC: uncomplicated.    Vitals:   12/27/16 0709 12/27/16 0729  BP: 124/81 132/79  Pulse: (!) 105 91  Resp: 18 20  Temp: 98.1 F (36.7 C) 97.8 F (36.6 C)     Lungs/Cor:  NAD Abdomen:  soft, gravid Ex:  no cords, erythema SVE:  3/60/-3, well applied, thin mec with AROM. FHTs:  130s, good STV, NST R Toco:  q q3-10   A/P   Term induction for Class B DM.    GBS  neg.  Avriana Joo A

## 2016-12-27 NOTE — Lactation Note (Signed)
This note was copied from a baby's chart. Lactation Consultation Note  Patient Name: Haley Norris [id number]'s Date: 12/27/2016 Reason for consult: Initial assessment Baby at 2 hr of life. Baby was not cueing but mom was receptive to latch help because the baby had not gone to breast in lifetime. Baby has a nice gape and flanged lips but was not interested in eating. Baby does sound a little congested. Discussed baby behavior, feeding frequency, baby belly size, voids, wt loss, breast changes, and nipple care. Demonstrated manual expression, colostrum noted bilaterally, spoon in room. Given lactation handouts. Aware of OP services and support group.     Maternal Data Has patient been taught Hand Expression?: Yes Does the patient have breastfeeding experience prior to this delivery?: No  Feeding Feeding Type: Breast Fed Length of feed: 0 min  LATCH Score/Interventions Latch: Too sleepy or reluctant, no latch achieved, no sucking elicited.                    Lactation Tools Discussed/Used WIC Program: No   Consult Status Consult Status: Follow-up Date: 12/28/16 Follow-up type: In-patient    [name] 12/27/2016, 9:35 PM

## 2016-12-27 NOTE — Anesthesia Procedure Notes (Signed)
Epidural Patient location during procedure: OB Start time: 12/27/2016 11:34 AM End time: 12/27/2016 11:38 AM  Staffing Anesthesiologist: Leilani AbleHATCHETT, Kemon Devincenzi Performed: anesthesiologist   Preanesthetic Checklist Completed: patient identified, surgical consent, pre-op evaluation, timeout performed, IV checked, risks and benefits discussed and monitors and equipment checked  Epidural Patient position: sitting Prep: site prepped and draped and DuraPrep Patient monitoring: continuous pulse ox and blood pressure Approach: midline Location: L3-L4 Injection technique: LOR air  Needle:  Needle type: Tuohy  Needle gauge: 17 G Needle length: 9 cm and 9 Needle insertion depth: 7 cm Catheter type: closed end flexible Catheter size: 19 Gauge Catheter at skin depth: 12 cm Test dose: negative and Other  Assessment Sensory level: T9 Events: blood not aspirated, injection not painful, no injection resistance, negative IV test and no paresthesia  Additional Notes Reason for block:procedure for pain

## 2016-12-28 ENCOUNTER — Encounter (HOSPITAL_COMMUNITY): Payer: Self-pay

## 2016-12-28 LAB — CBC
HCT: 33.3 % — ABNORMAL LOW (ref 36.0–46.0)
Hemoglobin: 10.9 g/dL — ABNORMAL LOW (ref 12.0–15.0)
MCH: 26.9 pg (ref 26.0–34.0)
MCHC: 32.7 g/dL (ref 30.0–36.0)
MCV: 82.2 fL (ref 78.0–100.0)
Platelets: 328 10*3/uL (ref 150–400)
RBC: 4.05 MIL/uL (ref 3.87–5.11)
RDW: 15.2 % (ref 11.5–15.5)
WBC: 14.3 10*3/uL — ABNORMAL HIGH (ref 4.0–10.5)

## 2016-12-28 LAB — GLUCOSE, CAPILLARY
GLUCOSE-CAPILLARY: 92 mg/dL (ref 65–99)
Glucose-Capillary: 109 mg/dL — ABNORMAL HIGH (ref 65–99)
Glucose-Capillary: 87 mg/dL (ref 65–99)
Glucose-Capillary: 125 mg/dL — ABNORMAL HIGH (ref 65–99)
Glucose-Capillary: 109 mg/dL — ABNORMAL HIGH (ref 65–99)

## 2016-12-28 NOTE — Progress Notes (Signed)
Post Partum Day 1 Subjective: no complaints, up ad lib, voiding, tolerating PO, + flatus and bottle feeding  Objective: Blood pressure 133/70, pulse 92, temperature 98.3 F (36.8 C), temperature source Oral, resp. rate 16, height 5\' 9"  (1.753 m), weight 123.8 kg (273 lb), last menstrual period 03/27/2016, SpO2 98 %, unknown if currently breastfeeding.  Physical Exam:  General: alert, cooperative and no distress Lochia: appropriate Uterine Fundus: firm perineum: healing well, no significant drainage, no dehiscence, no significant erythema DVT Evaluation: No evidence of DVT seen on physical exam. Negative Homan's sign. No cords or calf tenderness. No significant calf/ankle edema.   Recent Labs  12/27/16 0156 12/28/16 0543  HGB 11.5* 10.9*  HCT 34.9* 33.3*    Assessment/Plan: Plan for discharge tomorrow  Continue fasting and post prandial BS checks   LOS: 1 day   Haley Norris STACIA 12/28/2016, 8:32 AM

## 2016-12-28 NOTE — Anesthesia Postprocedure Evaluation (Signed)
Anesthesia Post Note  Patient: Haley Norris  Procedure(s) Performed: * No procedures listed *  Patient location during evaluation: Mother Baby Anesthesia Type: Epidural Level of consciousness: awake Pain management: satisfactory to patient Vital Signs Assessment: post-procedure vital signs reviewed and stable Respiratory status: spontaneous breathing Cardiovascular status: stable Anesthetic complications: no        Last Vitals:  Vitals:   12/27/16 2210 12/28/16 0200  BP: 122/61 127/63  Pulse: (!) 105 83  Resp: 16 16  Temp: 37.6 C 37 C    Last Pain:  Vitals:   12/28/16 0545  TempSrc:   PainSc: 0-No pain   Pain Goal: Patients Stated Pain Goal: 8 (12/27/16 0729)               Cephus ShellingBURGER,Jaylee Freeze

## 2016-12-29 LAB — GLUCOSE, CAPILLARY
Glucose-Capillary: 83 mg/dL (ref 65–99)
Glucose-Capillary: 76 mg/dL (ref 65–99)

## 2016-12-29 MED ORDER — IBUPROFEN 800 MG PO TABS: 800.0000 mg | ORAL_TABLET | Freq: Three times a day (TID) | ORAL | 0 refills | Status: DC

## 2016-12-29 NOTE — Discharge Summary (Signed)
Obstetric Discharge Summary Reason for Admission: induction of labor DM2 Prenatal Procedures: none Intrapartum Procedures: spontaneous vaginal delivery Postpartum Procedures: none Complications-Operative and Postpartum: 2 degree perineal laceration Hemoglobin  Date Value Ref Range Status  12/28/2016 10.9 (L) 12.0 - 15.0 g/dL Final   HCT  Date Value Ref Range Status  12/28/2016 33.3 (L) 36.0 - 46.0 % Final    Physical Exam:  General: alert, cooperative and appears stated age Lochia: appropriate Uterine Fundus: firm DVT Evaluation: No evidence of DVT seen on physical exam. Negative Homan's sign. No cords or calf tenderness.  Discharge Diagnoses: Term Pregnancy-delivered  Discharge Information: Date: 12/29/2016 Activity: pelvic rest Diet: routine Medications: PNV, Ibuprofen and tylenol Condition: stable Instructions: refer to practice specific booklet Discharge to: home Follow-up Information    HORVATH,MICHELLE A, MD Follow up in 4 week(s).   Specialty:  Obstetrics and Gynecology Contact information: [address] [place] [address] [phone]           Newborn Data: Live born female  Birth Weight: 7 lb 5.8 oz (3340 g) APGAR: 8, 9  Home with mother.  Haley Norris GEFFEL Haley Norris 12/29/2016, 8:52 AM

## 2016-12-29 NOTE — Progress Notes (Signed)
Patient is doing well.  She is ambulating, voiding, tolerating PO.  Pain control is good.  Lochia is appropriate BG have all been at goal PP  Vitals:   12/28/16 0200 12/28/16 0816 12/28/16 1723 12/29/16 0503  BP: 127/63 133/70 129/70 133/83  Pulse: 83 92 97 89  Resp: 16   18  Temp: 98.6 F (37 C) 98.3 F (36.8 C) 98.6 F (37 C) 98.2 F (36.8 C)  TempSrc:  Oral Oral Oral  SpO2:      Weight:      Height:        NAD Fundus firm Ext: no edema  Lab Results  Component Value Date   WBC 14.3 (H) 12/28/2016   HGB 10.9 (L) 12/28/2016   HCT 33.3 (L) 12/28/2016   MCV 82.2 12/28/2016   PLT 328 12/28/2016    --/--/B POS, B POS (01/23 0156)/Rimmune  A/P 25 y.o. G1P1001 PPD#2. Routine care.  Meeting all goals.  D/c to home today Cont metformin BID.    [facility] GEFFEL [employer]

## 2016-12-29 NOTE — Progress Notes (Signed)
MOB was referred for history of depression/anxiety. * Referral screened out by Clinical Social Worker because none of the following criteria appear to apply: ~ History of anxiety/depression during this pregnancy, or of post-partum depression. ~ Diagnosis of anxiety and/or depression within last 3 years OR * MOB's symptoms currently being treated with medication and/or therapy. Please contact the Clinical Social Worker if needs arise, or if MOB requests.   

## 2017-01-04 IMAGING — CR DG ABDOMEN 1V
2 series · 2 of 2 positions shown · non-contrast
Comparison: None.

CLINICAL DATA: Abdominal pain, bloating

EXAM:
ABDOMEN - 1 VIEW

[view not recorded (1 of 2)]
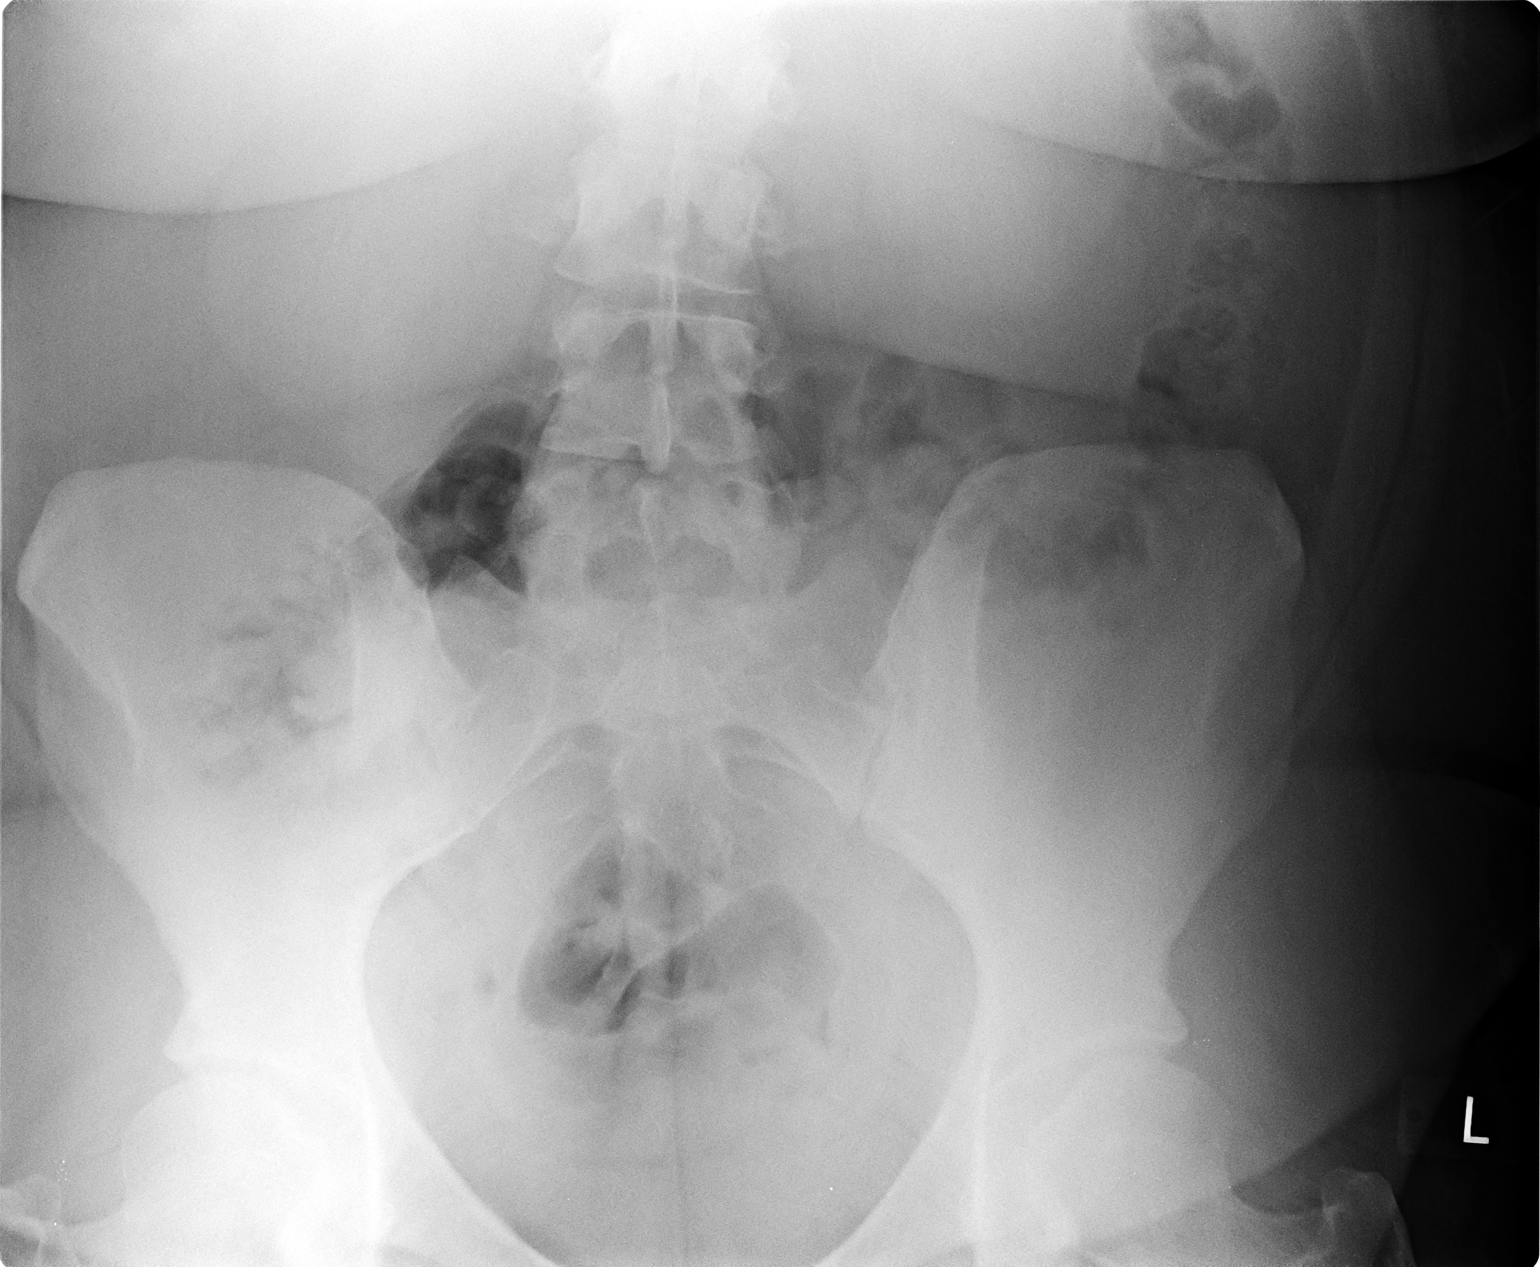

[view not recorded (2 of 2)]
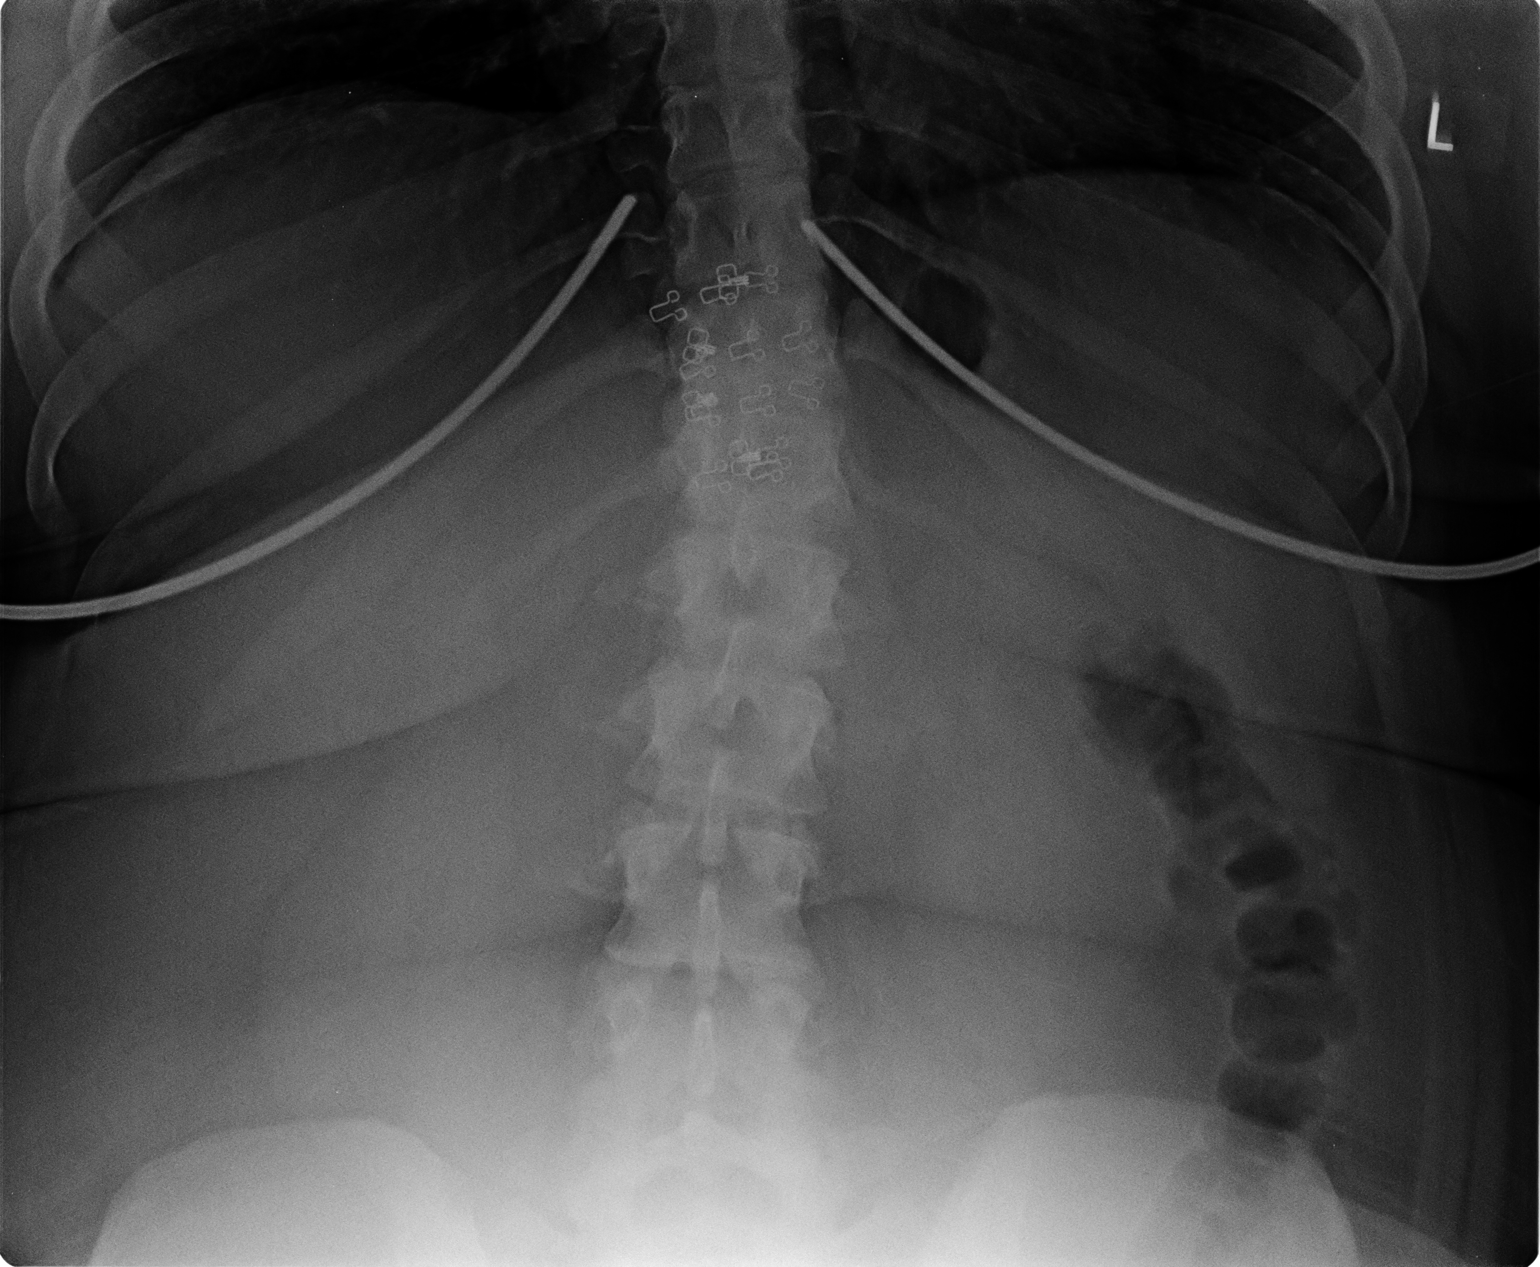

[2 of 2 positions shown; findings below may reference images not displayed]

FINDINGS: The bowel gas pattern is normal. No radio-opaque calculi or other
significant radiographic abnormality are seen.
IMPRESSION: Negative.

## 2017-02-24 ENCOUNTER — Encounter: Payer: Self-pay | Admitting: Family

## 2017-02-24 ENCOUNTER — Ambulatory Visit (INDEPENDENT_AMBULATORY_CARE_PROVIDER_SITE_OTHER): Payer: Managed Care, Other (non HMO) | Admitting: Family

## 2017-02-24 VITALS — BP 128/82 | HR 93 | Temp 98.6°F | Ht 69.0 in | Wt 246.0 lb

## 2017-02-24 DIAGNOSIS — F319 Bipolar disorder, unspecified: Secondary | ICD-10-CM | POA: Diagnosis not present

## 2017-02-24 LAB — PREGNANCY, URINE: PREG TEST UR: NEGATIVE

## 2017-02-24 MED ORDER — BUSPIRONE HCL 10 MG PO TABS: 10.0000 mg | ORAL_TABLET | Freq: Three times a day (TID) | ORAL | 1 refills | Status: DC

## 2017-02-24 MED ORDER — DIVALPROEX SODIUM ER 500 MG PO TB24: 1000.0000 mg | ORAL_TABLET | Freq: Every day | ORAL | 1 refills | Status: DC

## 2017-02-24 NOTE — Progress Notes (Signed)
   Subjective:    Patient ID: Haley Norris, female    DOB: 10/17/1991, 26 y.o.   MRN: [id number]  PT presents to the office today for recurrent GAD. Pt has been on Celexa and effoxor with no relief. Pt's father was diagnosed with bipolar and states he see's the same characteristics in his daughter that he struggled with.  Father states pt has these "highs" and then "lows" and can be explosive in her reactions.  Anxiety  Presents for follow-up visit. Symptoms include decreased concentration, depressed mood, excessive worry, hyperventilation, insomnia, irritability, nervous/anxious behavior and restlessness. Patient reports no suicidal ideas. Symptoms occur constantly.    Depression         This is a chronic problem.  The current episode started more than 1 year ago.   The problem has been waxing and waning since onset.  Associated symptoms include decreased concentration, insomnia, irritable, restlessness and sad.  Associated symptoms include no helplessness, no hopelessness, no decreased interest and no suicidal ideas.  Past treatments include nothing.  Past medical history includes anxiety.       Review of Systems  Constitutional: Positive for irritability.  Psychiatric/Behavioral: Positive for decreased concentration and depression. Negative for suicidal ideas. The patient is nervous/anxious and has insomnia.   All other systems reviewed and are negative.      Objective:   Physical Exam  Constitutional: She is oriented to person, place, and time. She appears well-developed and well-nourished. She is irritable. No distress.  HENT:  Head: Normocephalic and atraumatic.  Right Ear: External ear normal.  Left Ear: External ear normal.  Nose: Nose normal.  Mouth/Throat: Oropharynx is clear and moist.  Eyes: Pupils are equal, round, and reactive to light.  Cardiovascular: Normal rate, regular rhythm, normal heart sounds and intact distal pulses.   No murmur heard. Pulmonary/Chest: Effort  normal and breath sounds normal. No respiratory distress. She has no wheezes.  Abdominal: Soft. Bowel sounds are normal. She exhibits no distension. There is no tenderness.  Musculoskeletal: Normal range of motion. She exhibits no edema or tenderness.  Neurological: She is alert and oriented to person, place, and time.  Skin: Skin is warm and dry.  Psychiatric: She has a normal mood and affect. Her behavior is normal. Judgment and thought content normal.  Vitals reviewed.     BP 128/82   Pulse 93   Temp 98.6 F (37 C) (Oral)   Ht 5\' 9"  (1.753 m)   Wt 246 lb (111.6 kg)   Breastfeeding? No   BMI 36.33 kg/m      Assessment & Plan:  1. Bipolar 1 disorder (HCC) -Pt started on depakote and buspar Told to follow up with Psychiatry  -RTO in office in 1 months  - divalproex (DEPAKOTE ER) 500 MG 24 hr tablet; Take 2 tablets (1,000 mg total) by mouth daily.  Dispense: 180 tablet; Refill: 1 - Pregnancy, urine - busPIRone (BUSPAR) 10 MG tablet; Take 1 tablet (10 mg total) by mouth 3 (three) times daily.  Dispense: 90 tablet; Refill: 1 - Ambulatory referral to Psychiatry   Continue all meds Labs pending Health Maintenance reviewed Diet and exercise encouraged RTO 1 month  Jannifer Rodneyhristy Hoang Reich, FNP

## 2017-02-24 NOTE — Patient Instructions (Signed)
Bipolar 1 Disorder Bipolar 1 disorder is a mental health disorder in which a person has episodes of emotional highs (mania), and may also have episodes of emotional lows (depression) in addition to highs. Bipolar 1 disorder is different from other bipolar disorders because it involves extreme manic episodes. These episodes last at least one week or involve symptoms that are so severe that hospitalization is needed to keep the person safe. What increases the risk? The cause of this condition is not known. However, certain factors make you more likely to have bipolar disorder, such as:  Having a family member with the disorder.  An imbalance of certain chemicals in the brain (neurotransmitters).  Stress, such as illness, financial problems, or a death.  Certain conditions that affect the brain or spinal cord (neurologic conditions).  Brain injury (trauma).  Having another mental health disorder, such as:  Obsessive compulsive disorder.  Schizophrenia. What are the signs or symptoms? Symptoms of mania include:  Very high self-esteem or self-confidence.  Decreased need for sleep.  Unusual talkativeness or feeling a need to keep talking. Speech may be very fast. It may seem like you cannot stop talking.  Racing thoughts or constant talking, with quick shifts between topics that may or may not be related (flight of ideas).  Decreased ability to focus or concentrate.  Increased purposeful activity, such as work, studies, or social activity.  Increased nonproductive activity. This could be pacing, squirming and fidgeting, or finger and toe tapping.  Impulsive behavior and poor judgment. This may result in high-risk activities, such as having unprotected sex or spending a lot of money. Symptoms of depression include:  Feeling sad, hopeless, or helpless.  Frequent or uncontrollable crying.  Lack of feeling or caring about anything.  Sleeping too much.  Moving more slowly than  usual.  Not being able to enjoy things you used to enjoy.  Wanting to be alone all the time.  Feeling guilty or worthless.  Lack of energy or motivation.  Trouble concentrating or remembering.  Trouble making decisions.  Increased appetite.  Thoughts of death, or the desire to harm yourself. Sometimes, you may have a mixed mood. This means having symptoms of depression and mania. Stress can make symptoms worse. How is this diagnosed? To diagnose bipolar disorder, your health care provider may ask about your:  Emotional episodes.  Medical history.  Alcohol and drug use. This includes prescription medicines. Certain medical conditions and substances can cause symptoms that seem like bipolar disorder (secondary bipolar disorder). How is this treated? Bipolar disorder is a long-term (chronic) illness. It is best controlled with ongoing (continuous) treatment rather than treatment only when symptoms occur. Treatment may include:  Medicine. Medicine can be prescribed by a provider who specializes in treating mental disorders (psychiatrist).  Medicines called mood stabilizers are usually prescribed.  If symptoms occur even while taking a mood stabilizer, other medicines may be added.  Psychotherapy. Some forms of talk therapy, such as cognitive-behavioral therapy (CBT), can provide support, education, and guidance.  Coping methods, such as journaling or relaxation exercises. These may include:  Yoga.  Meditation.  Deep breathing.  Lifestyle changes, such as:  Limiting alcohol and drug use.  Exercising regularly.  Getting plenty of sleep.  Making healthy eating choices. A combination of medicine, talk therapy, and coping methods is best. A procedure in which electricity is applied to the brain through the scalp (electroconvulsive therapy) may be used in cases of severe mania when medicine and psychotherapy work too slowly  or do not work. Follow these instructions at  home: Activity    Return to your normal activities as told by your health care provider.  Find activities that you enjoy, and make time to do them.  Exercise regularly as told by your health care provider. Lifestyle   Limit alcohol intake to no more than 1 drink a day for nonpregnant women and 2 drinks a day for men. One drink equals 12 oz of beer, 5 oz of wine, or 1 oz of hard liquor.  Follow a set schedule for eating and sleeping.  Eat a balanced diet that includes fresh fruits and vegetables, whole grains, low-fat dairy, and lean meat.  Get 7-8 hours of sleep each night. General instructions   Take over-the-counter and prescription medicines only as told by your health care provider.  Think about joining a support group. Your health care provider may be able to recommend a support group.  Talk with your family and loved ones about your treatment goals and how they can help.  Keep all follow-up visits as told by your health care provider. This is important. Where to find more information: For more information about bipolar disorder, visit the following websites:  The [employer] Alliance on Mental Illness: www.nami.org  U.S[employer] Institute of Mental Health: http://www.maynard.net/www.nimh.nih.gov Contact a health care provider if:  Your symptoms get worse.  You have side effects from your medicine, and they get worse.  You have trouble sleeping.  You have trouble doing daily activities.  You feel unsafe in your surroundings.  You are dealing with substance abuse. Get help right away if:  You have new symptoms.  You have thoughts about harming yourself.  You self-harm. This information is not intended to replace advice given to you by your health care provider. Make sure you discuss any questions you have with your health care provider. Document Released: 02/27/2001 Document Revised: 07/17/2016 Document Reviewed: 07/21/2016 Elsevier Interactive Patient Education  2017 [employer]  [employer]  Your provider wants you to schedule an appointment with a Psychologist/Psychiatrist. The following list of offices requires the patient to call and make their own appointment, as there is information they need that only you can provide. Please feel free to choose form the following providers:  [facility]   [phone] Crisis Recovery in [place] [phone]  [facility]  [phone]   [address] [place], [place]  (Scheduled through [facility]) Must call and do an interview for appointment. Sees Children / Accepts Medicaid  [facility]    [phone]  [address]    [place], [place]       [facility]  [phone] [address] [place], [place]  Evaluates for Autism but does not treat it Sees Children / Accepts Medicaid  [facility]    [phone] [address], [address]   [place], [place] Medication management, substance abuse, bipolar, grief, family, marriage, OCD, anxiety, PTSD Sees children / Accepts Medicaid  [facility]    [phone] [address] [place], [place] Sees children / [facility]  [facility]  [phone] [address] [place], [place]   Dr Estelle GrumblesAkinlayo     [phone] [address] [place], [place]  Sees ADD & ADHD for treatment Accepts Medicaid  [facility]  [phone] [address] [place], [place] Evaluates for Autism Accepts [facility]  [facility]  [phone] [address] [place], [place]  Does Adult ADD evaluations Does not accept Medicaid  [facility]   [phone] [address] [place], [place] Uses animal therapy  Sees children as young as 14 years old Accepts [facility]     [phone]    [address]  [place], [place] [address] Sees children Accepts Medicaid

## 2017-03-24 ENCOUNTER — Ambulatory Visit: Payer: Managed Care, Other (non HMO) | Admitting: Family Medicine

## 2017-03-27 ENCOUNTER — Encounter: Payer: Self-pay | Admitting: Family

## 2017-04-19 ENCOUNTER — Telehealth: Payer: Self-pay | Admitting: Family

## 2017-04-20 NOTE — Telephone Encounter (Signed)
[facility] to let them know that [facility] scanned records today

## 2017-09-08 ENCOUNTER — Ambulatory Visit (INDEPENDENT_AMBULATORY_CARE_PROVIDER_SITE_OTHER): Payer: Managed Care, Other (non HMO) | Admitting: Family Medicine

## 2017-09-08 ENCOUNTER — Encounter: Payer: Self-pay | Admitting: Family Medicine

## 2017-09-08 VITALS — BP 128/74 | HR 90 | Temp 98.9°F | Ht 69.0 in | Wt 266.0 lb

## 2017-09-08 DIAGNOSIS — L918 Other hypertrophic disorders of the skin: Secondary | ICD-10-CM

## 2017-09-08 NOTE — Progress Notes (Signed)
   BP 128/74   Pulse 90   Temp 98.9 F (37.2 C) (Oral)   Ht  (1.753 m)   Wt 266 lb (120.7 kg)   BMI 39.28 kg/m    Subjective:    Patient ID: Haley Norris, female    DOB: 1990-12-27, 26 y.o.   MRN: [id number]  HPI: Haley Norris is a 26 y.o. female presenting on 09/08/2017 for Skin tag removal   HPI Skin tag removal Patient is coming in for skin tag removal because its outer Irritates her and also shows and she is going to a wedding soon. Addressed and she wants to not have been there to either bother her irritated or for appearances  Relevant past medical, surgical, family and social history reviewed and updated as indicated. Interim medical history since our last visit reviewed. Allergies and medications reviewed and updated.  Review of Systems  Constitutional: Negative for chills and fever.  Eyes: Negative for visual disturbance.  Respiratory: Negative for chest tightness and shortness of breath.   Cardiovascular: Negative for chest pain and leg swelling.  Musculoskeletal: Negative for back pain and gait problem.  Skin: Negative for rash.  Neurological: Negative for light-headedness and headaches.  Psychiatric/Behavioral: Negative for agitation and behavioral problems.  All other systems reviewed and are negative.   Per HPI unless specifically indicated above   Allergies as of 09/08/2017   No Known Allergies     Medication List       Accurate as of 09/08/17 11:45 AM. Always use your most recent med list.          metFORMIN 500 MG tablet Commonly known as:  GLUCOPHAGE Take 1,000 mg by mouth 2 (two) times daily with a meal.   SPRINTEC 28 0.25-35 MG-MCG tablet Generic drug:  norgestimate-ethinyl estradiol          Objective:    BP 128/74   Pulse 90   Temp 98.9 F (37.2 C) (Oral)   Ht  (1.753 m)   Wt 266 lb (120.7 kg)   BMI 39.28 kg/m   Wt Readings from Last 3 Encounters:  09/08/17 266 lb (120.7 kg)  02/24/17 246 lb (111.6 kg)  12/27/16  273 lb (123.8 kg)    Physical Exam  Constitutional: She appears well-developed and well-nourished. No distress.  Eyes: Conjunctivae are normal.  Musculoskeletal: Normal range of motion.  Neurological: She is alert.  Skin: Skin is warm and dry. Lesion (6 skin tags on left neck line and 3 on right  neck line) noted. No rash noted. She is not diaphoretic.  Nursing note and vitals reviewed.   Skin tag removal: Use forceps and iris scissors to elevate and cut at the stalk of each and put simple bandage over top afterwards. Patient tolerated well with minimal bleeding. Removed 6 from the left side of her neck line and 3 from the right side    Assessment & Plan:   Problem List Items Addressed This Visit    None    Visit Diagnoses    Skin tag    -  Primary       Follow up plan: Return if symptoms worsen or fail to improve.  Counseling provided for all of the vaccine components No orders of the defined types were placed in this encounter.   Arville Care, MD [facility] 09/08/2017, 11:45 AM

## 2017-09-27 ENCOUNTER — Encounter: Payer: Self-pay | Admitting: Family

## 2017-09-27 ENCOUNTER — Ambulatory Visit (INDEPENDENT_AMBULATORY_CARE_PROVIDER_SITE_OTHER): Payer: Managed Care, Other (non HMO)

## 2017-09-27 ENCOUNTER — Ambulatory Visit (INDEPENDENT_AMBULATORY_CARE_PROVIDER_SITE_OTHER): Payer: Managed Care, Other (non HMO) | Admitting: Family

## 2017-09-27 VITALS — BP 133/94 | HR 115 | Temp 97.0°F | Ht 69.0 in | Wt 265.0 lb

## 2017-09-27 DIAGNOSIS — R05 Cough: Secondary | ICD-10-CM | POA: Diagnosis not present

## 2017-09-27 DIAGNOSIS — J189 Pneumonia, unspecified organism: Secondary | ICD-10-CM

## 2017-09-27 DIAGNOSIS — R0602 Shortness of breath: Secondary | ICD-10-CM | POA: Diagnosis not present

## 2017-09-27 DIAGNOSIS — R059 Cough, unspecified: Secondary | ICD-10-CM

## 2017-09-27 MED ORDER — DOXYCYCLINE HYCLATE 100 MG PO TABS: 100.0000 mg | ORAL_TABLET | Freq: Two times a day (BID) | ORAL | 0 refills | Status: DC

## 2017-09-27 MED ORDER — PREDNISONE 10 MG (21) PO TBPK: ORAL_TABLET | ORAL | 0 refills | Status: DC

## 2017-09-27 NOTE — Patient Instructions (Signed)

## 2017-09-27 NOTE — Progress Notes (Signed)
Subjective:    Patient ID: Haley Norris, female    DOB: May 10, 1991, 26 y.o.   MRN: [id number]  Pt presents to the office today with recurrent cough started on 09/19/17 and went to Urgent Care on 09/24/17 and started on a Zpak. Pt states she started feeling worse yesterday with sweating, dizziness, and extreme fatigue.  Cough  This is a new problem. The current episode started 1 to 4 weeks ago. The problem has been gradually worsening. The problem occurs every few minutes. The cough is non-productive. Associated symptoms include chills, ear congestion, myalgias, postnasal drip and wheezing. Pertinent negatives include no ear pain, fever, headaches, rhinorrhea, sore throat or shortness of breath. The symptoms are aggravated by lying down. She has tried OTC cough suppressant and rest (Zpak) for the symptoms. The treatment provided mild relief.      Review of Systems  Constitutional: Positive for chills. Negative for fever.  HENT: Positive for postnasal drip. Negative for ear pain, rhinorrhea and sore throat.   Respiratory: Positive for cough and wheezing. Negative for shortness of breath.   Musculoskeletal: Positive for myalgias.  Neurological: Negative for headaches.  All other systems reviewed and are negative.      Objective:   Physical Exam  Constitutional: She is oriented to person, place, and time. She appears well-developed and well-nourished. No distress.  HENT:  Head: Normocephalic and atraumatic.  Right Ear: External ear normal. Tympanic membrane is erythematous.  Nose: Mucosal edema and rhinorrhea present.  Mouth/Throat: Posterior oropharyngeal erythema present.  Eyes: Pupils are equal, round, and reactive to light.  Neck: Normal range of motion. Neck supple. No thyromegaly present.  Cardiovascular: Normal rate, regular rhythm, normal heart sounds and intact distal pulses.   No murmur heard. Pulmonary/Chest: Effort normal. No respiratory distress. She has decreased breath  sounds. She has no wheezes. She has rhonchi.  Abdominal: Soft. Bowel sounds are normal. She exhibits no distension. There is no tenderness.  Musculoskeletal: Normal range of motion. She exhibits no edema or tenderness.  Neurological: She is alert and oriented to person, place, and time.  Skin: Skin is warm and dry.  Psychiatric: She has a normal mood and affect. Her behavior is normal. Judgment and thought content normal.  Vitals reviewed.     BP (!) 133/94   Pulse (!) 115   Temp (!) 97 F (36.1 C) (Oral)   Ht 5\' 9"  (1.753 m)   Wt 265 lb (120.2 kg)   BMI 39.13 kg/m      Assessment & Plan:  1. Cough - DG Chest 2 View; Future - doxycycline (VIBRA-TABS) 100 MG tablet; Take 1 tablet (100 mg total) by mouth 2 (two) times daily.  Dispense: 20 tablet; Refill: 0 - predniSONE (STERAPRED UNI-PAK 21 TAB) 10 MG (21) TBPK tablet; Use as directed  Dispense: 21 tablet; Refill: 0  2. SOB (shortness of breath) - DG Chest 2 View; Future - doxycycline (VIBRA-TABS) 100 MG tablet; Take 1 tablet (100 mg total) by mouth 2 (two) times daily.  Dispense: 20 tablet; Refill: 0 - predniSONE (STERAPRED UNI-PAK 21 TAB) 10 MG (21) TBPK tablet; Use as directed  Dispense: 21 tablet; Refill: 0  3. Atypical pneumonia - doxycycline (VIBRA-TABS) 100 MG tablet; Take 1 tablet (100 mg total) by mouth 2 (two) times daily.  Dispense: 20 tablet; Refill: 0 - predniSONE (STERAPRED UNI-PAK 21 TAB) 10 MG (21) TBPK tablet; Use as directed  Dispense: 21 tablet; Refill: 0  Force fluids Rest Stop Zpak and start Doxycycline  Strict low carb diet RTO prn or if symptoms do not improve or worsen  Jannifer Rodneyhristy Adalberto Metzgar, FNP

## 2018-02-06 NOTE — Progress Notes (Deleted)
Subjective: CC: URI PCP: Junie SpencerHawks, Christy A, FNP WUJ:WJXBJHPI:Haley Norris is a 27 y.o. female presenting to clinic today for:  1. Cold symptoms  Patient reports *** that started ***.  *** cough, hemoptysis, congestion, rhinorrhea, sinus pressure, headache, SOB, dizziness, rash, nausea, vomiting, diarrhea, fevers, chills, myalgia, sick contacts, recent travel.  Patient has used *** with *** relief of symptoms.  *** history of COPD or asthma.  *** tobacco use/ exposure.    ROS: Per HPI  No Known Allergies Past Medical History:  Diagnosis Date  . Anxiety   . Diabetes mellitus without complication (HCC)   . Elevated liver enzymes   . Fatty liver   . GERD (gastroesophageal reflux disease)   . PCOS (polycystic ovarian syndrome)     Current Outpatient Medications:  .  doxycycline (VIBRA-TABS) 100 MG tablet, Take 1 tablet (100 mg total) by mouth 2 (two) times daily., Disp: 20 tablet, Rfl: 0 .  metFORMIN (GLUCOPHAGE) 500 MG tablet, Take 1,000 mg by mouth 2 (two) times daily with a meal. , Disp: , Rfl:  .  predniSONE (STERAPRED UNI-PAK 21 TAB) 10 MG (21) TBPK tablet, Use as directed, Disp: 21 tablet, Rfl: 0 .  SPRINTEC 28 0.25-35 MG-MCG tablet, , Disp: , Rfl:  Social History   Socioeconomic History  . Marital status: Married    Spouse name: Not on file  . Number of children: Not on file  . Years of education: Not on file  . Highest education level: Not on file  Social Needs  . Financial resource strain: Not on file  . Food insecurity - worry: Not on file  . Food insecurity - inability: Not on file  . Transportation needs - medical: Not on file  . Transportation needs - non-medical: Not on file  Occupational History  . Not on file  Tobacco Use  . Smoking status: Never Smoker  . Smokeless tobacco: Never Used  Substance and Sexual Activity  . Alcohol use: No  . Drug use: No  . Sexual activity: Yes  Other Topics Concern  . Not on file  Social History Narrative  . Not on file    Family History  Problem Relation Age of Onset  . Healthy Mother   . Healthy Father   . Healthy Brother   . Heart disease Maternal Grandfather   . Diabetes Maternal Grandfather   . Kidney disease Maternal Grandfather   . Heart disease Paternal Grandfather   . Hypertension Paternal Grandfather   . Diabetes Paternal Grandfather     Objective: Office vital signs reviewed. There were no vitals taken for this visit.  Physical Examination:  General: Awake, alert, *** nourished, No acute distress HEENT: Normal    Neck: No masses palpated. No lymphadenopathy    Ears: Tympanic membranes intact, normal light reflex, no erythema, no bulging    Eyes: PERRLA, extraocular membranes intact, sclera ***    Nose: nasal turbinates moist, *** nasal discharge    Throat: moist mucus membranes, no erythema, *** tonsillar exudate.  Airway is patent Cardio: regular rate and rhythm, S1S2 heard, no murmurs appreciated Pulm: clear to auscultation bilaterally, no wheezes, rhonchi or rales; normal work of breathing on room air GI: soft, non-tender, non-distended, bowel sounds present x4, no hepatomegaly, no splenomegaly, no masses GU: external vaginal tissue ***, cervix ***, *** punctate lesions on cervix appreciated, *** discharge from cervical os, *** bleeding, *** cervical motion tenderness, *** abdominal/ adnexal masses Extremities: warm, well perfused, No edema, cyanosis or clubbing; +***  pulses bilaterally MSK: *** gait and *** station Skin: dry; intact; no rashes or lesions Neuro: *** Strength and light touch sensation grossly intact, *** DTRs ***/4  Assessment/ Plan: 27 y.o. female   ***  No orders of the defined types were placed in this encounter.  No orders of the defined types were placed in this encounter.    Raliegh Ip, DO [facility] [phone]

## 2018-02-07 ENCOUNTER — Ambulatory Visit: Payer: Managed Care, Other (non HMO) | Admitting: Family Medicine

## 2018-02-07 ENCOUNTER — Encounter: Payer: Self-pay | Admitting: Family Medicine

## 2018-02-07 ENCOUNTER — Ambulatory Visit (INDEPENDENT_AMBULATORY_CARE_PROVIDER_SITE_OTHER): Payer: Self-pay | Admitting: Family Medicine

## 2018-02-07 VITALS — BP 130/92 | HR 130 | Temp 97.3°F | Ht 69.0 in | Wt 277.1 lb

## 2018-02-07 DIAGNOSIS — J0100 Acute maxillary sinusitis, unspecified: Secondary | ICD-10-CM

## 2018-02-07 MED ORDER — BENZONATATE 200 MG PO CAPS: 200.0000 mg | ORAL_CAPSULE | Freq: Three times a day (TID) | ORAL | 0 refills | Status: AC | PRN

## 2018-02-07 MED ORDER — AMOXICILLIN-POT CLAVULANATE 875-125 MG PO TABS: 1.0000 | ORAL_TABLET | Freq: Two times a day (BID) | ORAL | 0 refills | Status: AC

## 2018-02-07 NOTE — Patient Instructions (Signed)
Use Delsym DM for cough as needed

## 2018-02-07 NOTE — Progress Notes (Signed)
Chief Complaint  Patient presents with  . Cough    pt here today c/o cough, congestion, ears stopped up and sinus pressure.    HPI  Patient presents today for Patient presents with upper respiratory congestion. Rhinorrhea that is frequently purulent. There is moderate sore throat. Patient reports coughing frequently as well.  No sputum noted. There is no fever, chills or sweats. The patient denies being short of breath. Onset was 3-5 days ago. Gradually worsening. Tried OTCs without improvement.  PMH: Smoking status noted ROS: Per HPI  Objective: BP (!) 130/92   Pulse (!) 130   Temp (!) 97.3 F (36.3 C) (Oral)   Ht 5\' 9"  (1.753 m)   Wt 277 lb 2 oz (125.7 kg)   SpO2 97%   BMI 40.92 kg/m  Gen: NAD, alert, cooperative with exam HEENT: NCAT, Nasal passages swollen, red TMS nml. Left max sinus is tender to percussin CV: RRR, good S1/S2, no murmur Resp: Bronchitis changes , non-labored Ext: No edema, warm Neuro: Alert and oriented, No gross deficits  Assessment and plan:  1. Acute maxillary sinusitis, recurrence not specified     Meds ordered this encounter  Medications  . amoxicillin-clavulanate (AUGMENTIN) 875-125 MG tablet    Sig: Take 1 tablet by mouth 2 (two) times daily.    Dispense:  20 tablet    Refill:  0  . benzonatate (TESSALON) 200 MG capsule    Sig: Take 1 capsule (200 mg total) by mouth 3 (three) times daily as needed for cough.    Dispense:  20 capsule    Refill:  0    No orders of the defined types were placed in this encounter.   Follow up as needed.  Mechele ClaudeWarren Punam Broussard, MD

## 2021-10-04 ENCOUNTER — Encounter: Payer: BLUE CROSS/BLUE SHIELD | Attending: Family Medicine

## 2022-05-03 ENCOUNTER — Inpatient Hospital Stay
Admission: EM | Admit: 2022-05-03 | Discharge: 2022-05-05 | Disposition: A | Discharge status: Home / Self Care | Payer: BLUE CROSS/BLUE SHIELD | Admitting: Student in an Organized Health Care Education/Training Program

## 2022-05-03 ENCOUNTER — Emergency Department: Admit: 2022-05-03 | Payer: BLUE CROSS/BLUE SHIELD | Primary: Family Medicine

## 2022-05-03 DIAGNOSIS — A419 Sepsis, unspecified organism: Secondary | ICD-10-CM

## 2022-05-03 LAB — CBC WITH AUTO DIFFERENTIAL
Absolute Immature Granulocyte: 0.1 10*3/uL (ref 0.0–0.5)
Basophils %: 1 % (ref 0.0–2.0)
Basophils Absolute: 0.1 10*3/uL (ref 0.0–0.2)
Eosinophils %: 1 % (ref 0.5–7.8)
Eosinophils Absolute: 0.2 10*3/uL (ref 0.0–0.8)
Hematocrit: 43.4 % (ref 35.8–46.3)
Hemoglobin: 13.8 g/dL (ref 11.7–15.4)
Immature Granulocytes: 1 % (ref 0.0–5.0)
Lymphocytes %: 27 % (ref 13–44)
Lymphocytes Absolute: 4.2 10*3/uL (ref 0.5–4.6)
MCH: 27.2 PG (ref 26.1–32.9)
MCHC: 31.8 g/dL (ref 31.4–35.0)
MCV: 85.6 FL (ref 82.0–102.0)
MPV: 9.2 FL — ABNORMAL LOW (ref 9.4–12.3)
Monocytes %: 6 % (ref 4.0–12.0)
Monocytes Absolute: 0.9 10*3/uL (ref 0.1–1.3)
Neutrophils %: 64 % (ref 43–78)
Neutrophils Absolute: 9.9 10*3/uL — ABNORMAL HIGH (ref 1.7–8.2)
Platelets: 621 10*3/uL — ABNORMAL HIGH (ref 150–450)
RBC: 5.07 M/uL (ref 4.05–5.2)
RDW: 13.4 % (ref 11.9–14.6)
WBC: 15.4 10*3/uL — ABNORMAL HIGH (ref 4.3–11.1)
nRBC: 0 10*3/uL (ref 0.0–0.2)

## 2022-05-03 LAB — TROPONIN: Troponin, High Sensitivity: 3 pg/mL (ref 0–14)

## 2022-05-03 LAB — COMPREHENSIVE METABOLIC PANEL
ALT: 40 U/L (ref 12–65)
AST: 29 U/L (ref 15–37)
Albumin/Globulin Ratio: 0.8 (ref 0.4–1.6)
Albumin: 3.7 g/dL (ref 3.5–5.0)
Alk Phosphatase: 68 U/L (ref 50–130)
Anion Gap: 10 mmol/L (ref 2–11)
BUN: 6 MG/DL (ref 6–23)
CO2: 25 mmol/L (ref 21–32)
Calcium: 9.7 MG/DL (ref 8.3–10.4)
Chloride: 105 mmol/L (ref 101–110)
Creatinine: 0.75 MG/DL (ref 0.6–1.0)
Est, Glom Filt Rate: >60 mL/min/{1.73_m2} (ref >60)
Globulin: 4.5 g/dL (ref 2.8–4.5)
Glucose: 132 mg/dL — ABNORMAL HIGH (ref 65–100)
Potassium: 4.1 mmol/L (ref 3.5–5.1)
Sodium: 140 mmol/L (ref 133–143)
Total Bilirubin: 0.4 MG/DL (ref 0.2–1.1)
Total Protein: 8.2 g/dL (ref 6.3–8.2)

## 2022-05-03 LAB — PROCALCITONIN: Procalcitonin: <0.05 ng/mL (ref 0.00–0.49)

## 2022-05-03 LAB — LACTATE, SEPSIS
Lactic Acid, Sepsis: 3.7 MMOL/L (ref 0.4–2.0)
Lactic Acid, Sepsis: 3.9 MMOL/L (ref 0.4–2.0)

## 2022-05-03 LAB — POC PREGNANCY UR-QUAL: Preg Test, Ur: NEGATIVE

## 2022-05-03 LAB — POCT GLUCOSE: POC Glucose: 126 mg/dL — ABNORMAL HIGH (ref 65–100)

## 2022-05-03 LAB — D-DIMER, QUANTITATIVE: D-Dimer, Quant: <0.27 ug/ml(FEU) (ref <0.56)

## 2022-05-03 LAB — LIPASE: Lipase: 128 U/L (ref 73–393)

## 2022-05-03 MED ORDER — SODIUM CHLORIDE 0.9 % IV BOLUS
0.9 % | Freq: Once | INTRAVENOUS | Status: AC
Start: 2022-05-03 — End: 2022-05-03
  Administered 2022-05-03: 20:00:00 1000 mL via INTRAVENOUS

## 2022-05-03 MED ORDER — POTASSIUM BICARB-CITRIC ACID 20 MEQ PO TBEF
20 MEQ | ORAL | Status: AC | PRN
Start: 2022-05-03 — End: 2022-05-05

## 2022-05-03 MED ORDER — ACETAMINOPHEN 325 MG PO TABS
325 | Freq: Four times a day (QID) | ORAL | Status: DC | PRN
Start: 2022-05-03 — End: 2022-05-05
  Administered 2022-05-04: 08:00:00 650 mg via ORAL

## 2022-05-03 MED ORDER — INSULIN LISPRO 100 UNIT/ML IJ SOLN
100 UNIT/ML | Freq: Three times a day (TID) | INTRAMUSCULAR | Status: AC
Start: 2022-05-03 — End: 2022-05-05

## 2022-05-03 MED ORDER — POLYETHYLENE GLYCOL 3350 17 G PO PACK
17 g | Freq: Every day | ORAL | Status: AC | PRN
Start: 2022-05-03 — End: 2022-05-05

## 2022-05-03 MED ORDER — ACETAMINOPHEN 650 MG RE SUPP
650 | Freq: Four times a day (QID) | RECTAL | Status: DC | PRN
Start: 2022-05-03 — End: 2022-05-05

## 2022-05-03 MED ORDER — INSULIN LISPRO 100 UNIT/ML IJ SOLN
100 UNIT/ML | Freq: Every evening | INTRAMUSCULAR | Status: AC
Start: 2022-05-03 — End: 2022-05-05

## 2022-05-03 MED ORDER — IOPAMIDOL 76 % IV SOLN
76 % | Freq: Once | INTRAVENOUS | Status: AC | PRN
Start: 2022-05-03 — End: 2022-05-03
  Administered 2022-05-03: 17:00:00 100 mL via INTRAVENOUS

## 2022-05-03 MED ORDER — DIPHENHYDRAMINE HCL 50 MG/ML IJ SOLN
50 MG/ML | INTRAMUSCULAR | Status: AC
Start: 2022-05-03 — End: 2022-05-03
  Administered 2022-05-03: 16:00:00 25 mg via INTRAVENOUS

## 2022-05-03 MED ORDER — ENOXAPARIN SODIUM 30 MG/0.3ML IJ SOSY
30 MG/0.3ML | Freq: Two times a day (BID) | INTRAMUSCULAR | Status: DC
Start: 2022-05-03 — End: 2022-05-04
  Administered 2022-05-04 (×2): 30 mg via SUBCUTANEOUS

## 2022-05-03 MED ORDER — PIPERACILLIN SOD-TAZOBACTAM SO 3.375 (3-0.375) G IV SOLR
3.375 (3-0.375) g | Freq: Three times a day (TID) | INTRAVENOUS | Status: DC
Start: 2022-05-03 — End: 2022-05-03

## 2022-05-03 MED ORDER — FAMOTIDINE 20 MG PO TABS
20 MG | Freq: Every day | ORAL | Status: AC | PRN
Start: 2022-05-03 — End: 2022-05-05

## 2022-05-03 MED ORDER — VANCOMYCIN 1500 MG NS 500 ML (PREMIX) IVPB
Freq: Two times a day (BID) | Status: DC
Start: 2022-05-03 — End: 2022-05-04

## 2022-05-03 MED ORDER — NORMAL SALINE FLUSH 0.9 % IV SOLN
0.9 % | INTRAVENOUS | Status: DC | PRN
Start: 2022-05-03 — End: 2022-05-05

## 2022-05-03 MED ORDER — POTASSIUM CHLORIDE 10 MEQ/100ML IV SOLN
10 MEQ/0ML | INTRAVENOUS | Status: AC | PRN
Start: 2022-05-03 — End: 2022-05-05

## 2022-05-03 MED ORDER — SODIUM CHLORIDE 0.9 % IV BOLUS
0.9 % | Freq: Once | INTRAVENOUS | Status: AC
Start: 2022-05-03 — End: 2022-05-03
  Administered 2022-05-03: 16:00:00 1000 mL via INTRAVENOUS

## 2022-05-03 MED ORDER — NORMAL SALINE FLUSH 0.9 % IV SOLN
0.9 % | Freq: Two times a day (BID) | INTRAVENOUS | Status: AC
Start: 2022-05-03 — End: 2022-05-05
  Administered 2022-05-04 – 2022-05-05 (×4): 10 mL via INTRAVENOUS

## 2022-05-03 MED ORDER — ALUM & MAG HYDROXIDE-SIMETH 200-200-20 MG/5ML PO SUSP
200-200-20 MG/5ML | Freq: Four times a day (QID) | ORAL | Status: AC | PRN
Start: 2022-05-03 — End: 2022-05-05

## 2022-05-03 MED ORDER — SODIUM CHLORIDE 0.9 % IV SOLN
0.9 % | INTRAVENOUS | Status: AC | PRN
Start: 2022-05-03 — End: 2022-05-05

## 2022-05-03 MED ORDER — POTASSIUM CHLORIDE CRYS ER 20 MEQ PO TBCR
20 MEQ | ORAL | Status: AC | PRN
Start: 2022-05-03 — End: 2022-05-05

## 2022-05-03 MED ORDER — VANCOMYCIN 2500 MG IN NS 500 ML (PREMIX) IVPB
Freq: Once | Status: AC
Start: 2022-05-03 — End: 2022-05-03
  Administered 2022-05-03: 22:00:00 2500 mg via INTRAVENOUS

## 2022-05-03 MED ORDER — MAGNESIUM SULFATE 2000 MG/50 ML IVPB PREMIX
2 GM/50ML | INTRAVENOUS | Status: AC | PRN
Start: 2022-05-03 — End: 2022-05-05

## 2022-05-03 MED ORDER — ONDANSETRON 4 MG PO TBDP
4 MG | Freq: Three times a day (TID) | ORAL | Status: AC | PRN
Start: 2022-05-03 — End: 2022-05-05

## 2022-05-03 MED ORDER — LABETALOL HCL 5 MG/ML IV SOLN
5 MG/ML | INTRAVENOUS | Status: AC | PRN
Start: 2022-05-03 — End: 2022-05-05

## 2022-05-03 MED ORDER — BISACODYL 10 MG RE SUPP
10 MG | Freq: Every day | RECTAL | Status: AC | PRN
Start: 2022-05-03 — End: 2022-05-05

## 2022-05-03 MED ORDER — METOCLOPRAMIDE HCL 5 MG/ML IJ SOLN
5 MG/ML | INTRAMUSCULAR | Status: AC
Start: 2022-05-03 — End: 2022-05-03
  Administered 2022-05-03: 16:00:00 10 mg via INTRAVENOUS

## 2022-05-03 MED ORDER — LACTATED RINGERS IV SOLN
INTRAVENOUS | Status: DC
Start: 2022-05-03 — End: 2022-05-04
  Administered 2022-05-04: 02:00:00 via INTRAVENOUS

## 2022-05-03 MED ORDER — VENLAFAXINE HCL ER 75 MG PO CP24
75 MG | Freq: Every day | ORAL | Status: DC
Start: 2022-05-03 — End: 2022-05-04

## 2022-05-03 MED ORDER — PIPERACILLIN SOD-TAZOBACTAM SO 4.5 (4-0.5) G IV SOLR
4.54-0.5 (4-0.5) g | Freq: Once | INTRAVENOUS | Status: AC
Start: 2022-05-03 — End: 2022-05-03
  Administered 2022-05-03: 17:00:00 4500 mg via INTRAVENOUS

## 2022-05-03 MED ORDER — ONDANSETRON HCL 4 MG/2ML IJ SOLN
4 MG/2ML | Freq: Four times a day (QID) | INTRAMUSCULAR | Status: AC | PRN
Start: 2022-05-03 — End: 2022-05-05

## 2022-05-03 MED FILL — METOCLOPRAMIDE HCL 5 MG/ML IJ SOLN: 5 MG/ML | INTRAMUSCULAR | Qty: 2

## 2022-05-03 MED FILL — PIPERACILLIN SOD-TAZOBACTAM SO 4.5 (4-0.5) G IV SOLR: 4.5 (4-0.5) g | INTRAVENOUS | Qty: 4500

## 2022-05-03 MED FILL — VANCOMYCIN 2500 MG IN NS 500 ML (PREMIX) IVPB: Qty: 500

## 2022-05-03 MED FILL — DIPHENHYDRAMINE HCL 50 MG/ML IJ SOLN: 50 MG/ML | INTRAMUSCULAR | Qty: 1

## 2022-05-03 NOTE — ED Notes (Signed)
Report given to [place], RN and [place], RN     Alcide Clever, RN  05/03/22 [phone]

## 2022-05-03 NOTE — ED Triage Notes (Signed)
Pt alert and ambulatory to triage. She reports N/V/D and left upper abdominal pain starting Friday. Also c/o fatigue. States this is a chronic issue.

## 2022-05-03 NOTE — Progress Notes (Signed)
TRANSFER - IN REPORT:    Verbal report received from [place], RN on Sonya French  being received from ED for routine progression of patient care      Report consisted of patient's Situation, Background, Assessment and   Recommendations(SBAR).     Information from the following report(s) Nurse Handoff Report, ED SBAR, and Recent Results was reviewed with the receiving nurse.    Opportunity for questions and clarification was provided.      Assessment completed upon patient's arrival to unit and care assumed.     No stool for collection at this time.

## 2022-05-03 NOTE — H&P (Addendum)
Hospitalist History and Physical   Admit Date:  05/03/2022 11:53 AM   Name:  Sonya French   Age:  31 y.o.  Sex:  female  DOB:  1991-01-18   MRN:  [id number]   Room:  ER03/03    Presenting/Chief Complaint: Abdominal Pain     Reason(s) for Admission: Sepsis ([place]) [A41.9]     History of Present Illness:   Sonya French is a 31 y.o. female with medical history of gestational diabetes, depression/anxiety who presented with worsening left upper quadrant abdominal pain, nausea and diarrhea.  Patient reports that started on Friday and has gotten worse.  Having loose stools 5-8 episodes per day along with nausea but no vomiting.  Tolerating p.o. diet okay.  Denies any fever or chills.  Does not have any member with similar symptoms.  Denies any gallstones in the past.    In the emergency room, patient was tachycardic to 130s and hypertensive 167/106.  Laboratory work-up is remarkable for for lactic acid of 3.7 with repeat of 3.9, WBC 15.4.  Chest x-ray without any acute infiltrate.  CT abdomen and pelvis shows left adnexal cyst.  Pelvis and transvaginal ultrasound shows 4.5 cm left ovarian cyst.  Urine dipstick negative for pregnancy and urinary tract infection.      Assessment & Plan:   Sepsis  Unclear etiology.  Patient criteria for sepsis with tachycardia and leukocytosis. Possible abdominal source given N with diarrhea  Chest x-ray and CT scan independently reviewed without any acute abnormalities suggestive of infection.  Urinalysis negative  -Follow-up Blood cultures  -Empiric antibiotics with Zosyn and vancomycin for now.  -Check Vanco trough for vancomycin toxicity  -IV fluids  -check Cdiff, stool WBC, stool culture, stool O&P    Tachycardia  EKG independently reviewed which showed sinus tachycardia  -Check troponin and d dimer. Pt does not have CP or SOB  -IV fluids    Hypertensive emergency  Blood pressure elevated to 167/106  -Labetalol IV 10 mg as needed  -No history of hypertension on antihypertensive  medications at home    Type 2 diabetes/gestational diabetes  On metformin at home  -Hold oral metformin at this time.  Continue with insulin sliding scale    Depression/anxiety: Continue with Effexor    Obesity  Body mass index is 43.54 kg/m.  Increased risk of all cause mortality, complicating care      Critical care time: 38 minutes    Patient is critically ill.  Without intervention, there is a high probability of acute organ impairment or life-threatening deterioration in the patient's condition from: hypertensive emergency    Critical care interventions: labetalol IV     Due to a high probability of clinically significant, life-threatening deterioration that could result in multiorgan failure,  this critically ill or injured patient required my highest level preparedness to intervene emergent.    This critical care time included time spent at bedside performing patient interview and physical exam, time spent researching patient prior to interaction with patient, time spent discussing findings and treatment plan with patient and/or family, time spent discussing patient with consultants and colleagues, time spent reviewing pertinent laboratory and radiographic evaluations, or time spent re-evaluating patient.  The time listed is exclusive of any procedures which may have been performed.       Diet: regular  VTE prophylaxis: Lovenox  Code status: FULL      Hospital Problems:  Principal Problem:    Sepsis ([place])  Active Problems:    Type 2 diabetes  mellitus, without long-term current use of insulin (HCC)    Class 3 severe obesity due to excess calories in adult [facility])    Hypertensive emergency    Depression  Resolved Problems:    * No resolved hospital problems. *       Past History:     Past Medical History:   Diagnosis Date    Depression     Gestational diabetes        No past surgical history on file.     Social History     Tobacco Use    Smoking status: Not on file    Smokeless tobacco: Not on file   Substance  Use Topics    Alcohol use: Not on file      Social History     Substance and Sexual Activity   Drug Use Not on file         There is no immunization history on file for this patient.  No Known Allergies  Prior to Admit Medications:  Current Outpatient Medications   Medication Instructions    metFORMIN (GLUCOPHAGE) 500 MG tablet metformin 500 mg tablet    norgestimate-ethinyl estradiol (ORTHO-CYCLEN) 0.25-35 MG-MCG per tablet No dose, route, or frequency recorded.    venlafaxine (EFFEXOR XR) 150 mg, Oral, DAILY         Objective:   Patient Vitals for the past 24 hrs:   Temp Pulse Resp BP SpO2   05/03/22 1630 -- (!) 125 11 (!) 140/92 92 %   05/03/22 1600 -- (!) 129 15 (!) 155/103 96 %   05/03/22 1400 -- (!) 126 14 (!) 167/106 97 %   05/03/22 1335 -- (!) 127 19 (!) 155/104 99 %   05/03/22 1245 -- (!) 130 18 (!) 163/105 97 %   05/03/22 1230 -- (!) 128 17 (!) 154/102 96 %   05/03/22 1215 -- (!) 126 18 (!) 154/100 96 %   05/03/22 1212 -- -- -- -- 95 %   05/03/22 1211 -- (!) 121 19 (!) 144/110 98 %   05/03/22 1148 98.4 F (36.9 C) (!) 122 16 (!) 136/101 97 %       Oxygen Therapy  SpO2: 92 %  Pulse via Oximetry: 127 beats per minute  O2 Device: None (Room air)    Estimated body mass index is 43.54 kg/m as calculated from the following:    Height as of this encounter: 5' 7"  (1.702 m).    Weight as of this encounter: 278 lb (126.1 kg).  No intake or output data in the 24 hours ending 05/03/22 1642      Physical Exam:    General:    Ill appearing.    Head:  Normocephalic, atraumatic  Eyes:  Sclerae appear normal.  Pupils equally round.  ENT:  Nares appear normal.  Moist oral mucosa  Neck:  No restricted ROM.  Trachea midline   CV:   RRR.  No m/r/g.  No jugular venous distension.  Lungs:   CTAB.  No wheezing, Symmetric expansion.  Abdomen:   Soft, slightly tender to LUQ,  nondistended.  Extremities: No cyanosis or clubbing.  No edema  Skin:     No rashes and normal coloration.   Warm and dry.    Neuro:  CN II-XII grossly  intact.  Sensation intact.   Psych:  Normal mood and affect.      Orders Placed This Encounter   Medications    0.9 % sodium chloride bolus  metoclopramide (REGLAN) injection 10 mg    diphenhydrAMINE (BENADRYL) injection 25 mg    iopamidol (ISOVUE-370) 76 % injection 100 mL    piperacillin-tazobactam (ZOSYN) 4,500 mg in sodium chloride 0.9 % 100 mL IVPB (mini-bag)     Order Specific Question:   Antimicrobial Indications     Answer:   Intra-Abdominal Infection    0.9 % sodium chloride bolus    labetalol (NORMODYNE;TRANDATE) injection 20 mg    piperacillin-tazobactam (ZOSYN) 3,375 mg in sodium chloride 0.9 % 50 mL IVPB (mini-bag)     Order Specific Question:   Antimicrobial Indications     Answer:   Sepsis of Unknown Etiology     Order Specific Question:   Antimicrobial Indications     Answer:   Intra-Abdominal Infection     Order Specific Question:   Sepsis duration of therapy     Answer:   7 days       I have personally reviewed labs and tests:  Recent Labs:  Recent Results (from the past 24 hour(s))   D-Dimer, Quantitative    Collection Time: 05/03/22 11:59 AM   Result Value Ref Range    D-Dimer, Quant <0.27 <0.56 ug/ml(FEU)   Procalcitonin    Collection Time: 05/03/22 11:59 AM   Result Value Ref Range    Procalcitonin <0.05 0.00 - 0.49 ng/mL   CBC with Diff    Collection Time: 05/03/22 12:00 PM   Result Value Ref Range    WBC 15.4 (H) 4.3 - 11.1 K/uL    RBC 5.07 4.05 - 5.2 M/uL    Hemoglobin 13.8 11.7 - 15.4 g/dL    Hematocrit 43.4 35.8 - 46.3 %    MCV 85.6 82.0 - 102.0 FL    MCH 27.2 26.1 - 32.9 PG    MCHC 31.8 31.4 - 35.0 g/dL    RDW 13.4 11.9 - 14.6 %    Platelets 621 (H) 150 - 450 K/uL    MPV 9.2 (L) 9.4 - 12.3 FL    nRBC 0.00 0.0 - 0.2 K/uL    Differential Type AUTOMATED      Neutrophils % 64 43 - 78 %    Lymphocytes % 27 13 - 44 %    Monocytes % 6 4.0 - 12.0 %    Eosinophils % 1 0.5 - 7.8 %    Basophils % 1 0.0 - 2.0 %    Immature Granulocytes 1 0.0 - 5.0 %    Neutrophils Absolute 9.9 (H) 1.7 - 8.2 K/UL     Lymphocytes Absolute 4.2 0.5 - 4.6 K/UL    Monocytes Absolute 0.9 0.1 - 1.3 K/UL    Eosinophils Absolute 0.2 0.0 - 0.8 K/UL    Basophils Absolute 0.1 0.0 - 0.2 K/UL    Absolute Immature Granulocyte 0.1 0.0 - 0.5 K/UL   CMP    Collection Time: 05/03/22 12:00 PM   Result Value Ref Range    Sodium 140 133 - 143 mmol/L    Potassium 4.1 3.5 - 5.1 mmol/L    Chloride 105 101 - 110 mmol/L    CO2 25 21 - 32 mmol/L    Anion Gap 10 2 - 11 mmol/L    Glucose 132 (H) 65 - 100 mg/dL    BUN 6 6 - 23 MG/DL    Creatinine 0.75 0.6 - 1.0 MG/DL    Est, Glom Filt Rate >60 >60 ml/min/1.42m    Calcium 9.7 8.3 - 10.4 MG/DL    Total Bilirubin 0.4 0.2 -  1.1 MG/DL    ALT 40 12 - 65 U/L    AST 29 15 - 37 U/L    Alk Phosphatase 68 50 - 130 U/L    Total Protein 8.2 6.3 - 8.2 g/dL    Albumin 3.7 3.5 - 5.0 g/dL    Globulin 4.5 2.8 - 4.5 g/dL    Albumin/Globulin Ratio 0.8 0.4 - 1.6     Lipase    Collection Time: 05/03/22 12:00 PM   Result Value Ref Range    Lipase 128 73 - 393 U/L   POC Pregnancy Urine Qual    Collection Time: 05/03/22 12:04 PM   Result Value Ref Range    Preg Test, Ur Negative NEG     Lactate, Sepsis    Collection Time: 05/03/22 12:56 PM   Result Value Ref Range    Lactic Acid, Sepsis 3.7 (HH) 0.4 - 2.0 MMOL/L   Lactate, Sepsis    Collection Time: 05/03/22  3:15 PM   Result Value Ref Range    Lactic Acid, Sepsis 3.9 (HH) 0.4 - 2.0 MMOL/L       I have personally reviewed imaging studies:  CT ABDOMEN PELVIS W IV CONTRAST Additional Contrast? None    Result Date: 05/03/2022  CT OF THE ABDOMEN AND PELVIS INDICATION: Left-sided abdominal pain, diarrhea Multiple axial images were obtained through the abdomen and pelvis.  Oral contrast was not administered.  179m of Isovue 370 intravenous contrast was used for better evaluation of solid organs and vascular structures.  Radiation dose reduction techniques were used for this study.  All CT scans performed at this facility use one or all of the following: Automated exposure control,  adjustment of the mA and/or kVp according to patient's size, iterative reconstruction. COMPARISON: None FINDINGS: - Lung Bases: No infiltrates or masses. - Liver: Unremarkable. - Biliary: No gallstones or bile duct dilatation. - Pancreas: Normal. - Spleen: Not enlarged, No mass. - Adrenals: Normal. - Kidneys/ureters: No hydronephrosis. No suspicious mass. - Bladder: Normal. - Bowel: Normal caliber.  No inflammatory changes. Normal-appearing appendix. - Lymph nodes: No significant retroperitoneal, mesenteric, or pelvic adenopathy. - Bones: No fracture or significant bone lesion. - Vasculature: No aneurysm. - Reproductive organs: 4.6 cm left adnexal cyst. - Other: No ascites.     4.6 cm left adnexal cyst.     XR CHEST PORTABLE    Result Date: 05/03/2022  Portable chest: History: sepsis Comparison: None Findings: A single view of the chest was obtained at 1509 hours. The cardiac silhouette is normal in size and configuration. The lungs and pleural spaces are clear. The pulmonary vascularity is within normal limits.     Unremarkable portable chest radiograph.     [place]PELVIS COMPLETE NON-OB TRANSABDOMINAL AND TRANSVAGINAL    Result Date: 05/03/2022  Pelvic and transvaginal ultrasound HISTORY: Left ovarian cyst. Abnormal CT scan. COMPARISON: None. Correlation is made to the CT scan of the abdomen and pelvis 04/3622. Pelvic ultrasound: The uterus measures approximately 9.6 x 4.5 x 4.6 cm. The uterus is retroverted The endometrial complex was measured at 2 mm. The left ovary measures approximately 4.4 x 4.5 x 2.8 cm. There is a cyst within the left ovary measuring greater than 4 cm. The right ovary was unable to be visualized due to overlying bowel gas and patient body habitus. Transvaginal imaging is recommended to further assess the endometrial complex and adnexa. A nabothian cyst is present within the cervix. Transvaginal ultrasound: There is no uterine mass. The endometrial complex is  normal at 4 mm. The right ovary was  unable to be visualized. The left ovary measures approximately 4.7 x 4.0 x 4.8 cm. There is a 4.5 cm cyst within the left ovary. Color flow is present within the left ovary. There is a trace amount free fluid within the pelvis.     1. 4.5 cm left ovarian cyst. 2. Nonvisualization of the right ovary.        Signed:  Helmut Muster, MD    Part of this note may have been written by using a voice dictation software.  The note has been proof read but may still contain some grammatical/other typographical errors.

## 2022-05-03 NOTE — Progress Notes (Signed)
VANCO DAILY FOLLOW UP NOTE  [facility]   Pharmacy Pharmacokinetic Monitoring Service - Vancomycin    Consulting Provider: Dr. Ezekiel Ina   Indication: sepsis  Target Concentration: Goal AUC/MIC 400-600 mg*hr/L  Day of Therapy: 1 of 7  Additional Antimicrobials: pip/tazo    Patient eligible for piperacillin-tazobactam to cefepime auto-substitution per P&T approved protocol? NO    Pertinent Laboratory Values:   Wt Readings from Last 1 Encounters:   05/03/22 278 lb (126.1 kg)     Temp Readings from Last 1 Encounters:   05/03/22 98.4 F (36.9 C) (Oral)     Recent Labs     05/03/22  1159 05/03/22  1200   BUN  --  6   CREATININE  --  0.75   WBC  --  15.4*   PROCAL <0.05  --      Estimated Creatinine Clearance: 153 mL/min (based on SCr of 0.75 mg/dL).    No results found for: Jeannine Kitten    MRSA Nasal Swab: N/A. Non-respiratory infection    Assessment:  Date/Time Dose Concentration AUC         Note: Serum concentrations collected for AUC dosing may appear elevated if collected in close proximity to the dose administered, this is not necessarily an indication of toxicity    Plan:  Dosing recommendations based on Bayesian software  Start vancomycin 2500 mg IV x 1 followed by 1500 mg IV q12h  Anticipated AUC of 530 and trough concentration of 13.3 at steady state  Renal labs as indicated   Vancomycin concentrations will be ordered as clinically appropriate   Pharmacy will continue to monitor patient and adjust therapy as indicated    Thank you for the consult,  Fleet Contras L. Mitzi Davenport, [facility]

## 2022-05-03 NOTE — Progress Notes (Signed)
Accessed pt chart to assist PCT

## 2022-05-03 NOTE — ED Provider Notes (Cosign Needed)
Emergency Department Provider Note       PCP: Sharlene Dory, MD   Age: 31 y.o.   Sex: female     DISPOSITION Admitted 05/03/2022 04:05:31 PM       ICD-10-CM    1. Septicemia (HCC)  A41.9       2. Left lower quadrant abdominal pain  R10.32       3. Sinus tachycardia  R00.0       4. Diarrhea, unspecified type  R19.7           Medical Decision Making     Complexity of Problems Addressed:  1 or more acute illnesses that pose a threat to life or bodily function.     Data Reviewed and Analyzed:  Category 1:   I independently ordered and reviewed each unique test.  I reviewed external records: provider visit note from outside specialist.       Category 2:   I interpreted the X-rays no pneumonia.  I interpreted the CT Scan no diverticulitis or free air.  I interpreted the Ultrasound  simple left ovarian cyst.  Agree with radiology on all imaging studies     Category 3: Discussion of management or test interpretation.  Patient is a 31 year old female with history of diabetes who presents with left lower abdominal pain diarrhea and nausea for 3 days.  Initially tachycardic on arrival.  White count 15,000.  Lactic 3.7.  Urine uninfected.  Chest x-ray clear.  CT abdomen pelvis shows no evidence of infection but does show left-sided ovarian cyst.  Ultrasound obtained.  No evidence of torsion on ultrasound as there is good flow to the left ovary.  Appears to be simple cyst.  Patient feeling better at this point in time, but meets sepsis criteria and persistently tachycardic.  Treated empirically with zosyn. She adamantly denies any chest pain or shortness of breath.  Hospitalist consulted for admission for further evaluation and management. Discussed with attending.   The patient was admitted and I have discussed patient management with the admitting provider.    Risk of Complications and/or Morbidity of Patient Management:  Discussion with external consultants.    ED Course as of 05/03/22 1633   Tue May 03, 2022   1314  EKG 12 Lead  EKG shows sinus tachycardia rate of 128.  No acute ischemic changes.  No STEMI.  QTc 478. [JD]   1344 Pain free. Still tachy 120s. She says she is always tachy like this. [JD]      ED Course User Index  [JD] Cristela Blue, PA       History      Kellee Mckoy is a 31 y.o. female who presents to the Emergency Department with chief complaint of    Chief Complaint   Patient presents with    Abdominal Pain      Patient 31 year old female with history of diabetes who comes in with left-sided abdominal pain along with diarrhea and nausea for 3 days.  She says she is having 10+ episodes of diarrhea daily.  She had problems with this in the past and used to see GI, but has not seen them in 6 or 7 years.  She has never had to go to the emergency room for the symptoms.  She denies fever or urinary complaints.  No chest pain or shortness of breath.  She denies recent antibiotic use or travel.       Review of Systems   Constitutional:  Positive  for fatigue.   HENT: Negative.     Respiratory: Negative.     Cardiovascular: Negative.    Gastrointestinal:  Positive for abdominal pain, diarrhea and nausea.   All other systems reviewed and are negative.    Physical Exam     Vitals signs and nursing note reviewed:  Vitals:    05/03/22 1230 05/03/22 1245 05/03/22 1335 05/03/22 1400   BP: (!) 154/102 (!) 163/105 (!) 155/104 (!) 167/106   Pulse: (!) 128 (!) 130 (!) 127 (!) 126   Resp: 17 18 19 14    Temp:       TempSrc:       SpO2: 96% 97% 99% 97%   Weight:       Height:          Physical Exam  Vitals and nursing note reviewed.   Constitutional:       General: She is not in acute distress.     Appearance: She is well-developed. She is obese. She is not ill-appearing or toxic-appearing.      Comments: Well in appearance in no acute distress.   HENT:      Head: Normocephalic.   Eyes:      Extraocular Movements: Extraocular movements intact.   Cardiovascular:      Rate and Rhythm: Regular rhythm. Tachycardia present.      Pulses:  Normal pulses.      Heart sounds: Normal heart sounds.   Pulmonary:      Effort: Pulmonary effort is normal.      Breath sounds: Normal breath sounds.   Abdominal:      General: Bowel sounds are normal.      Palpations: Abdomen is soft.      Tenderness: There is abdominal tenderness in the left upper quadrant and left lower quadrant. There is no guarding or rebound.   Musculoskeletal:         General: Normal range of motion.      Cervical back: Normal range of motion and neck supple.   Lymphadenopathy:      Cervical: No cervical adenopathy.   Skin:     General: Skin is warm and dry.      Findings: No rash.   Neurological:      General: No focal deficit present.      Mental Status: She is alert. Mental status is at baseline.   Psychiatric:         Mood and Affect: Mood normal.         Behavior: Behavior normal.         Thought Content: Thought content normal.        Procedures     Critical Care  Performed by: Cristela Blue, PA  Authorized by: Virgina Evener, MD     Critical care provider statement:     Critical care time (minutes):  69    Critical care was necessary to treat or prevent imminent or life-threatening deterioration of the following conditions:  Sepsis    Critical care was time spent personally by me on the following activities:  Discussions with consultants, evaluation of patient's response to treatment, examination of patient, ordering and performing treatments and interventions, ordering and review of laboratory studies, ordering and review of radiographic studies, pulse oximetry, re-evaluation of patient's condition and review of old charts    Care discussed with: admitting provider      Orders Placed This Encounter   Procedures    Critical Care    Culture,  Stool    Clostridium Difficile Toxin/Antigen    Blood Culture 1    Blood Culture 2    CT ABDOMEN PELVIS W IV CONTRAST Additional Contrast? None    US PELVIS COMPLETE NON-OB TRANSABDOMINAL AND TRANSVAGINAL    XR CHEST PORTABLE    CBC with Diff    CMP     Lipase    Lactate, Sepsis    D-Dimer, Quantitative    Procalcitonin    Troponin    Diet NPO    POCT Urine Dipstick    POC Pregnancy Urine Qual    POC Pregnancy Urine Qual    EKG 12 Lead    Saline lock IV    ADMIT TO INPATIENT        Medications   0.9 % sodium chloride bolus (1,000 mLs IntraVENous New Bag 05/03/22 1548)   labetalol (NORMODYNE;TRANDATE) injection 20 mg (has no administration in time range)   0.9 % sodium chloride bolus (0 mLs IntraVENous Stopped 05/03/22 1335)   metoclopramide (REGLAN) injection 10 mg (10 mg IntraVENous Given 05/03/22 1223)   diphenhydrAMINE (BENADRYL) injection 25 mg (25 mg IntraVENous Given 05/03/22 1223)   iopamidol (ISOVUE-370) 76 % injection 100 mL (100 mLs IntraVENous Given 05/03/22 1324)   piperacillin-tazobactam (ZOSYN) 4,500 mg in sodium chloride 0.9 % 100 mL IVPB (mini-bag) (0 mg IntraVENous Stopped 05/03/22 1402)       New Prescriptions    No medications on file        No past medical history on file.     No past surgical history on file.     Social History     Socioeconomic History    Marital status: Married        Previous Medications    METFORMIN (GLUCOPHAGE) 500 MG TABLET    metformin 500 mg tablet    NORGESTIMATE-ETHINYL ESTRADIOL (ORTHO-CYCLEN) 0.25-35 MG-MCG PER TABLET        VENLAFAXINE (EFFEXOR XR) 150 MG EXTENDED RELEASE CAPSULE    Take 1 capsule by mouth daily        Results for orders placed or performed during the hospital encounter of 05/03/22   CT ABDOMEN PELVIS W IV CONTRAST Additional Contrast? None    Narrative    CT OF THE ABDOMEN AND PELVIS    INDICATION: Left-sided abdominal pain, diarrhea    Multiple axial images were obtained through the abdomen and pelvis.  Oral  contrast was not administered.  of Isovue 370 intravenous contrast was  used for better evaluation of solid organs and vascular structures.  Radiation  dose reduction techniques were used for this study.  All CT scans performed at  this facility use one or all of the following: Automated  exposure control,  adjustment of the mA and/or kVp according to patient's size, iterative  reconstruction.    COMPARISON: None    FINDINGS:  - Lung Bases: No infiltrates or masses.  - Liver: Unremarkable.  - Biliary: No gallstones or bile duct dilatation.  - Pancreas: Normal.  - Spleen: Not enlarged, No mass.  - Adrenals: Normal.  - Kidneys/ureters: No hydronephrosis. No suspicious mass.  - Bladder: Normal.    - Bowel: Normal caliber.  No inflammatory changes. Normal-appearing appendix.  - Lymph nodes: No significant retroperitoneal, mesenteric, or pelvic adenopathy.  - Bones: No fracture or significant bone lesion.  - Vasculature: No aneurysm.  - Reproductive organs: 4.6 cm left adnexal cyst.  - Other: No ascites.      Impression  4.6 cm left adnexal cyst.         US PELVIS COMPLETE NON-OB TRANSABDOMINAL AND TRANSVAGINAL    Narrative    Pelvic and transvaginal ultrasound    HISTORY: Left ovarian cyst. Abnormal CT scan.    COMPARISON: None. Correlation is made to the CT scan of the abdomen and pelvis  04/3622.    Pelvic ultrasound: The uterus measures approximately 9.6 x 4.5 x 4.6 cm. The  uterus is retroverted The endometrial complex was measured at 2 mm. The left  ovary measures approximately 4.4 x 4.5 x 2.8 cm. There is a cyst within the left  ovary measuring greater than 4 cm. The right ovary was unable to be visualized  due to overlying bowel gas and patient body habitus. Transvaginal imaging is  recommended to further assess the endometrial complex and adnexa. A nabothian  cyst is present within the cervix.    Transvaginal ultrasound: There is no uterine mass. The endometrial complex is  normal at 4 mm. The right ovary was unable to be visualized. The left ovary  measures approximately 4.7 x 4.0 x 4.8 cm. There is a 4.5 cm cyst within the  left ovary. Color flow is present within the left ovary. There is a trace amount  free fluid within the pelvis.       Impression    1. 4.5 cm left ovarian cyst.  2.  Nonvisualization of the right ovary.   XR CHEST PORTABLE    Narrative    Portable chest:     History: sepsis    Comparison: None    Findings: A single view of the chest was obtained at 1509 hours.    The cardiac silhouette is normal in size and configuration. The lungs and  pleural spaces are clear. The pulmonary vascularity is within normal limits.      Impression    Unremarkable portable chest radiograph.             CBC with Diff   Result Value Ref Range    WBC 15.4 (H) 4.3 - 11.1 K/uL    RBC 5.07 4.05 - 5.2 M/uL    Hemoglobin 13.8 11.7 - 15.4 g/dL    Hematocrit 16.1 09.6 - 46.3 %    MCV 85.6 82.0 - 102.0 FL    MCH 27.2 26.1 - 32.9 PG    MCHC 31.8 31.4 - 35.0 g/dL    RDW 04.5 40.9 - 81.1 %    Platelets 621 (H) 150 - 450 K/uL    MPV 9.2 (L) 9.4 - 12.3 FL    nRBC 0.00 0.0 - 0.2 K/uL    Differential Type AUTOMATED      Neutrophils % 64 43 - 78 %    Lymphocytes % 27 13 - 44 %    Monocytes % 6 4.0 - 12.0 %    Eosinophils % 1 0.5 - 7.8 %    Basophils % 1 0.0 - 2.0 %    Immature Granulocytes 1 0.0 - 5.0 %    Neutrophils Absolute 9.9 (H) 1.7 - 8.2 K/UL    Lymphocytes Absolute 4.2 0.5 - 4.6 K/UL    Monocytes Absolute 0.9 0.1 - 1.3 K/UL    Eosinophils Absolute 0.2 0.0 - 0.8 K/UL    Basophils Absolute 0.1 0.0 - 0.2 K/UL    Absolute Immature Granulocyte 0.1 0.0 - 0.5 K/UL   CMP   Result Value Ref Range    Sodium 140 133 - 143 mmol/L  Potassium 4.1 3.5 - 5.1 mmol/L    Chloride 105 101 - 110 mmol/L    CO2 25 21 - 32 mmol/L    Anion Gap 10 2 - 11 mmol/L    Glucose 132 (H) 65 - 100 mg/dL    BUN 6 6 - 23 MG/DL    Creatinine 1.61 0.6 - 1.0 MG/DL    Est, Glom Filt Rate >60 >60 ml/min/1.74m2    Calcium 9.7 8.3 - 10.4 MG/DL    Total Bilirubin 0.4 0.2 - 1.1 MG/DL    ALT 40 12 - 65 U/L    AST 29 15 - 37 U/L    Alk Phosphatase 68 50 - 130 U/L    Total Protein 8.2 6.3 - 8.2 g/dL    Albumin 3.7 3.5 - 5.0 g/dL    Globulin 4.5 2.8 - 4.5 g/dL    Albumin/Globulin Ratio 0.8 0.4 - 1.6     Lipase   Result Value Ref Range    Lipase 128 73 - 393  U/L   Lactate, Sepsis   Result Value Ref Range    Lactic Acid, Sepsis 3.7 (HH) 0.4 - 2.0 MMOL/L   Lactate, Sepsis   Result Value Ref Range    Lactic Acid, Sepsis 3.9 (HH) 0.4 - 2.0 MMOL/L   D-Dimer, Quantitative   Result Value Ref Range    D-Dimer, Quant <0.27 <0.56 ug/ml(FEU)   Procalcitonin   Result Value Ref Range    Procalcitonin <0.05 0.00 - 0.49 ng/mL   POC Pregnancy Urine Qual   Result Value Ref Range    Preg Test, Ur Negative NEG          XR CHEST PORTABLE   Final Result   Unremarkable portable chest radiograph.                     US PELVIS COMPLETE NON-OB TRANSABDOMINAL AND TRANSVAGINAL   Final Result   1. 4.5 cm left ovarian cyst.   2. Nonvisualization of the right ovary.      CT ABDOMEN PELVIS W IV CONTRAST Additional Contrast? None   Final Result   4.6 cm left adnexal cyst.                                 Voice dictation software was used during the making of this note.  This software is not perfect and grammatical and other typographical errors may be present.  This note has not been completely proofread for errors.       Cristela Blue, [place]  05/03/22 [phone]

## 2022-05-03 NOTE — ED Notes (Signed)
TRANSFER - OUT REPORT:    Verbal report given to [place] on Pria Klosinski  being transferred to 354 for routine progression of patient care       Report consisted of patient's Situation, Background, Assessment and   Recommendations(SBAR).     Information from the following report(s) ED SBAR was reviewed with the receiving nurse.    Lines:   Peripheral IV 05/03/22 Left Antecubital (Active)       Peripheral IV 05/03/22 Right Antecubital (Active)        Opportunity for questions and clarification was provided.      Patient transported with:  Arther Dames, RN  05/03/22 [id number]

## 2022-05-04 ENCOUNTER — Inpatient Hospital Stay: Admit: 2022-05-04 | Payer: BLUE CROSS/BLUE SHIELD | Primary: Family Medicine

## 2022-05-04 LAB — URINE DRUG SCREEN
Amphetamine, Urine: NEGATIVE
Barbiturates, Urine: NEGATIVE
Benzodiazepines, Urine: NEGATIVE
Cocaine, Urine: NEGATIVE
Methadone, Urine: NEGATIVE
Opiates, Urine: NEGATIVE
PCP, Urine: NEGATIVE
THC, TH-Cannabinol, Urine: NEGATIVE

## 2022-05-04 LAB — ECHO (TTE) COMPLETE (PRN CONTRAST/BUBBLE/STRAIN/3D)
AV Area by Peak Velocity: 3.2 cm2
AV Area by VTI: 3.6 cm2
AV Mean Gradient: 4 mmHg
AV Mean Velocity: 1 m/s
AV Peak Gradient: 8 mmHg
AV Peak Velocity: 1.4 m/s
AV VTI: 24.3 cm
AV Velocity Ratio: 0.86
AVA/BSA Peak Velocity: 1.4 cm2/m2
AVA/BSA VTI: 1.5 cm2/m2
Ao Root Index: 1.46 cm/m2
Aortic Root: 3.4 cm
Ascending Aorta Index: 1.46 cm/m2
Ascending Aorta: 3.4 cm
Body Surface Area: 2.44 m2
E/E' Lateral: 7.92
E/E' Ratio (Averaged): 8.71
E/E' Septal: 9.5
EF BP: 63 % (ref 55–100)
Est. RA Pressure: 3 mmHg
Fractional Shortening 2D: 30 % (ref 28–44)
IVC Expiration: 1.8 cm
IVSd: 1 cm — AB (ref 0.6–0.9)
LA Area 2C: 15.2 cm2
LA Area 4C: 16.1 cm2
LA Diameter: 3.6 cm
LA Major Axis: 5.5 cm
LA Minor Axis: 5 cm
LA Size Index: 1.55 cm/m2
LA Volume A-L A4C: 37 mL (ref 22–52)
LA Volume A-L A4C: 38 mL (ref 22–52)
LA Volume BP: 39 mL (ref 22–52)
LA Volume Index A-L A2C: 16 mL/m2 (ref 16–34)
LA Volume Index A-L A4C: 16 mL/m2 (ref 16–34)
LA Volume Index BP: 17 ml/m2 (ref 16–34)
LA/AO Root Ratio: 1.06
LV E' Lateral Velocity: 12 cm/s
LV E' Septal Velocity: 10 cm/s
LV EDV A2C: 119 mL
LV EDV A4C: 133 mL
LV EDV Index A2C: 51 mL/m2
LV EDV Index A4C: 57 mL/m2
LV ESV A2C: 50 mL
LV ESV A4C: 46 mL
LV ESV Index A2C: 21 mL/m2
LV ESV Index A4C: 20 mL/m2
LV Ejection Fraction A2C: 58 %
LV Ejection Fraction A4C: 66 %
LV Mass 2D Index: 61.2 g/m2 (ref 43–95)
LV Mass 2D: 142.5 g (ref 67–162)
LV RWT Ratio: 0.47
LVIDd Index: 1.85 cm/m2
LVIDd: 4.3 cm (ref 3.9–5.3)
LVIDs Index: 1.29 cm/m2
LVIDs: 3 cm
LVOT Area: 3.8 cm2
LVOT Diameter: 2.2 cm
LVOT Mean Gradient: 4 mmHg
LVOT Peak Gradient: 5 mmHg
LVOT Peak Velocity: 1.2 m/s
LVOT SV: 87.4 ml
LVOT Stroke Volume Index: 37.5 mL/m2
LVOT VTI: 23 cm
LVOT:AV VTI Index: 0.95
LVPWd: 1 cm — AB (ref 0.6–0.9)
MV A Velocity: 1.17 m/s
MV E Velocity: 0.95 m/s
MV E Wave Deceleration Time: 126 ms
MV E/A: 0.81
PV AT: 108 ms
PV Max Velocity: 1.1 m/s
PV Peak Gradient: 4 mmHg
RV Free Wall Peak S': 13 cm/s
RVSP: 22 mmHg
TAPSE: 2.1 cm (ref 1.7–?)
TR Max Velocity: 2.16 m/s
TR Peak Gradient: 19 mmHg

## 2022-05-04 LAB — CBC WITH AUTO DIFFERENTIAL
Absolute Immature Granulocyte: 0 10*3/uL (ref 0.0–0.5)
Basophils %: 0 % (ref 0.0–2.0)
Basophils Absolute: 0 10*3/uL (ref 0.0–0.2)
Eosinophils %: 1 % (ref 0.5–7.8)
Eosinophils Absolute: 0.1 10*3/uL (ref 0.0–0.8)
Hematocrit: 35.6 % — ABNORMAL LOW (ref 35.8–46.3)
Hemoglobin: 11.6 g/dL — ABNORMAL LOW (ref 11.7–15.4)
Immature Granulocytes: 0 % (ref 0.0–5.0)
Lymphocytes %: 41 % (ref 13–44)
Lymphocytes Absolute: 5.2 10*3/uL — ABNORMAL HIGH (ref 0.5–4.6)
MCH: 27.6 PG (ref 26.1–32.9)
MCHC: 32.6 g/dL (ref 31.4–35.0)
MCV: 84.6 FL (ref 82.0–102.0)
MPV: 9.2 FL — ABNORMAL LOW (ref 9.4–12.3)
Monocytes %: 6 % (ref 4.0–12.0)
Monocytes Absolute: 0.8 10*3/uL (ref 0.1–1.3)
Neutrophils %: 52 % (ref 43–78)
Neutrophils Absolute: 6.5 10*3/uL (ref 1.7–8.2)
Platelet Comment: INCREASED
Platelets: 501 10*3/uL — ABNORMAL HIGH (ref 150–450)
RBC: 4.21 M/uL (ref 4.05–5.2)
RDW: 13.2 % (ref 11.9–14.6)
WBC: 12.6 10*3/uL — ABNORMAL HIGH (ref 4.3–11.1)
nRBC: 0 10*3/uL (ref 0.0–0.2)

## 2022-05-04 LAB — BASIC METABOLIC PANEL W/ REFLEX TO MG FOR LOW K
Anion Gap: 10 mmol/L (ref 2–11)
BUN: 4 MG/DL — ABNORMAL LOW (ref 6–23)
CO2: 22 mmol/L (ref 21–32)
Calcium: 8.4 MG/DL (ref 8.3–10.4)
Chloride: 108 mmol/L (ref 101–110)
Creatinine: 0.62 MG/DL (ref 0.6–1.0)
Est, Glom Filt Rate: >60 mL/min/{1.73_m2} (ref >60)
Glucose: 109 mg/dL — ABNORMAL HIGH (ref 65–100)
Potassium: 3.3 mmol/L — ABNORMAL LOW (ref 3.5–5.1)
Sodium: 140 mmol/L (ref 133–143)

## 2022-05-04 LAB — EKG 12-LEAD
Atrial Rate: 129 {beats}/min
P Axis: 37 degrees
P-R Interval: 126 ms
Q-T Interval: 336 ms
QRS Duration: 88 ms
QTc Calculation (Bazett): 492 ms
R Axis: 96 degrees
T Axis: 14 degrees
Ventricular Rate: 129 {beats}/min

## 2022-05-04 LAB — POCT GLUCOSE
POC Glucose: 133 mg/dL — ABNORMAL HIGH (ref 65–100)
POC Glucose: 124 mg/dL — ABNORMAL HIGH (ref 65–100)
POC Glucose: 134 mg/dL — ABNORMAL HIGH (ref 65–100)
POC Glucose: 131 mg/dL — ABNORMAL HIGH (ref 65–100)

## 2022-05-04 LAB — LACTIC ACID
Lactic Acid, Plasma: 2.3 MMOL/L — ABNORMAL HIGH (ref 0.4–2.0)
Lactic Acid, Plasma: 2.5 MMOL/L — ABNORMAL HIGH (ref 0.4–2.0)
Lactic Acid, Plasma: 3.5 MMOL/L — ABNORMAL HIGH (ref 0.4–2.0)
Lactic Acid, Plasma: 3.7 MMOL/L (ref 0.4–2.0)

## 2022-05-04 LAB — C DIFF TOXIN/ANTIGEN
C Diff Toxin Interpretation: NEGATIVE
C difficile Toxin, EIA: NEGATIVE
CLINICAL CONSIDERATION: NEGATIVE
GDH Antigen: NEGATIVE

## 2022-05-04 LAB — LACTATE, SEPSIS
Lactic Acid, Sepsis: 1.4 MMOL/L (ref 0.4–2.0)
Lactic Acid, Sepsis: 1.9 MMOL/L (ref 0.4–2.0)

## 2022-05-04 LAB — MAGNESIUM: Magnesium: 1.9 mg/dL (ref 1.8–2.4)

## 2022-05-04 MED ORDER — ALPRAZOLAM 0.5 MG PO TABS
0.5 MG | Freq: Every evening | ORAL | Status: AC | PRN
Start: 2022-05-04 — End: 2022-05-05
  Administered 2022-05-05: 04:00:00 0.5 mg via ORAL

## 2022-05-04 MED ORDER — METOPROLOL TARTRATE 25 MG PO TABS
25 MG | Freq: Two times a day (BID) | ORAL | Status: AC
Start: 2022-05-04 — End: 2022-05-05
  Administered 2022-05-04 – 2022-05-05 (×3): 25 mg via ORAL

## 2022-05-04 MED ORDER — METRONIDAZOLE 500 MG/100ML IV SOLN
500 MG/100ML | Freq: Three times a day (TID) | INTRAVENOUS | Status: AC
Start: 2022-05-04 — End: 2022-05-05
  Administered 2022-05-04 – 2022-05-05 (×3): 500 mg via INTRAVENOUS

## 2022-05-04 MED ORDER — MELATONIN 3 MG PO TABS
3 MG | Freq: Every evening | ORAL | Status: AC | PRN
Start: 2022-05-04 — End: 2022-05-05
  Administered 2022-05-04: 03:00:00 6 mg via ORAL

## 2022-05-04 MED ORDER — SODIUM CHLORIDE 0.9 % IV SOLN (MINI-BAG)
0.9 % | Freq: Three times a day (TID) | INTRAVENOUS | Status: DC
Start: 2022-05-04 — End: 2022-05-04
  Administered 2022-05-04 (×2): 3375 mg via INTRAVENOUS

## 2022-05-04 MED ORDER — MAGNESIUM OXIDE 400 (240 MG) MG PO TABS
400 (240 Mg) MG | Freq: Every day | ORAL | Status: AC
Start: 2022-05-04 — End: 2022-05-04
  Administered 2022-05-04: 14:00:00 400 mg via ORAL

## 2022-05-04 MED ORDER — POTASSIUM CHLORIDE CRYS ER 20 MEQ PO TBCR
20 MEQ | Freq: Two times a day (BID) | ORAL | Status: AC
Start: 2022-05-04 — End: 2022-05-04
  Administered 2022-05-04 (×2): 40 meq via ORAL

## 2022-05-04 MED ORDER — PERFLUTREN LIPID MICROSPHERE IV SUSP
0.9 % | Freq: Once | INTRAVENOUS | Status: AC | PRN
Start: 2022-05-04 — End: 2022-05-04
  Administered 2022-05-04: 18:00:00 0.45 mL via INTRAVENOUS

## 2022-05-04 MED ORDER — ENOXAPARIN SODIUM 30 MG/0.3ML IJ SOSY
30 MG/0.3ML | Freq: Two times a day (BID) | INTRAMUSCULAR | Status: AC
Start: 2022-05-04 — End: 2022-05-05
  Administered 2022-05-05 (×2): 30 mg via SUBCUTANEOUS

## 2022-05-04 MED ORDER — VENLAFAXINE HCL ER 75 MG PO CP24
75 MG | Freq: Every day | ORAL | Status: AC
Start: 2022-05-04 — End: 2022-05-05
  Administered 2022-05-04 – 2022-05-05 (×2): 150 mg via ORAL

## 2022-05-04 MED ORDER — CIPROFLOXACIN IN D5W 400 MG/200ML IV SOLN
400 MG/200ML | Freq: Two times a day (BID) | INTRAVENOUS | Status: AC
Start: 2022-05-04 — End: 2022-05-05
  Administered 2022-05-04 – 2022-05-05 (×2): 400 mg via INTRAVENOUS

## 2022-05-04 MED ORDER — SODIUM CHLORIDE 0.9 % IV BOLUS
0.9 % | Freq: Once | INTRAVENOUS | Status: AC
Start: 2022-05-04 — End: 2022-05-04
  Administered 2022-05-04: 19:00:00 1000 mL via INTRAVENOUS

## 2022-05-04 MED FILL — PIPERACILLIN SOD-TAZOBACTAM SO 3.375 (3-0.375) G IV SOLR: 3.375 (3-0.375) g | INTRAVENOUS | Qty: 3375

## 2022-05-04 MED FILL — ACETAMINOPHEN 325 MG PO TABS: 325 MG | ORAL | Qty: 2

## 2022-05-04 MED FILL — MELATONIN 3 MG PO TABS: 3 MG | ORAL | Qty: 2

## 2022-05-04 MED FILL — METOPROLOL TARTRATE 25 MG PO TABS: 25 MG | ORAL | Qty: 1

## 2022-05-04 MED FILL — VENLAFAXINE HCL ER 75 MG PO CP24: 75 MG | ORAL | Qty: 2

## 2022-05-04 MED FILL — CIPROFLOXACIN IN D5W 400 MG/200ML IV SOLN: 400 MG/200ML | INTRAVENOUS | Qty: 200

## 2022-05-04 MED FILL — PERFLUTREN LIPID MICROSPHERE IV SUSP: INTRAVENOUS | Qty: 2

## 2022-05-04 MED FILL — MAGNESIUM OXIDE 400 (240 MG) MG PO TABS: 400 (240 Mg) MG | ORAL | Qty: 1

## 2022-05-04 MED FILL — VANCOMYCIN 1500 MG NS 500 ML (PREMIX) IVPB: Qty: 500

## 2022-05-04 MED FILL — POTASSIUM CHLORIDE CRYS ER 20 MEQ PO TBCR: 20 MEQ | ORAL | Qty: 2

## 2022-05-04 MED FILL — METRONIDAZOLE 500 MG/100ML IV SOLN: 500 MG/100ML | INTRAVENOUS | Qty: 100

## 2022-05-04 MED FILL — ENOXAPARIN SODIUM 30 MG/0.3ML IJ SOSY: 30 MG/0.3ML | INTRAMUSCULAR | Qty: 0.3

## 2022-05-04 NOTE — Plan of Care (Signed)
Problem: Discharge Planning  Goal: Discharge to home or other facility with appropriate resources  Outcome: Progressing     Problem: Pain  Goal: Verbalizes/displays adequate comfort level or baseline comfort level  Outcome: Progressing

## 2022-05-04 NOTE — Care Coordination-Inpatient (Signed)
05/04/22 1519   Service Assessment   Patient Orientation Alert and Oriented   Cognition Alert   History Provided By Patient   Primary Caregiver Self   Support Systems Spouse/Significant Other   PCP Verified by CM Yes   Prior Functional Level Independent in ADLs/IADLs   Current Functional Level Independent in ADLs/IADLs   Can patient return to prior living arrangement Yes   Ability to make needs known: Good   Family able to assist with home care needs: Yes   Would you like for me to discuss the discharge plan with any other family members/significant others, and if so, who? Yes  (spouse)   Physiological scientist (Comment)  Nurse, mental health)   Community Resources None   Social/Functional History   Lives With Spouse   Type of Wisdom   (glucometer, glasses)   Receives Help From Family   ADL Assistance Independent   Mode of Scientist, product/process development   Discharge Planning   Type of Residence House   Living Arrangements Spouse/Significant Other   Current Services Prior To Admission Durable Medical Equipment   Current DME Prior to [place]   DME Ordered? No   Potential Assistance Purchasing Medications No   Type of Home Care Services None   Patient expects to be discharged to: [place] [facility]   Transition of Care Consult (CM Consult) N/A   Services At/After Discharge None     RN CM met with the patient and her spouse at the bedside and discussed discharge planning. She is independent of ADLs and does not use external services. Her husband will transport her home at discharge. RN CM encouraged her to ask questions. She verbalized understanding and agreement with the discharge plan. No case management needs were identified at this time.

## 2022-05-04 NOTE — Progress Notes (Signed)
Echo completed in patient room.

## 2022-05-04 NOTE — Progress Notes (Signed)
Urine was collected  and sent to lab for drug screen.

## 2022-05-04 NOTE — Progress Notes (Signed)
EKG completed in patient room.

## 2022-05-04 NOTE — Progress Notes (Signed)
Hospitalist Progress Note   Admit Date:  05/03/2022 11:53 AM   Name:  Sonya French   Age:  31 y.o.  Sex:  female  DOB:  [birth year]   MRN:  [id number]   Room:  354/01    Presenting/Chief Complaint: Abdominal Pain     Reason(s) for Admission: Sinus tachycardia [R00.0]  Septicemia ([place]) [A41.9]  Sepsis ([place]) [A41.9]  Diarrhea, unspecified type [R19.7]  Left lower quadrant abdominal pain [R10.32]     Hospital Course:   Sonya French is a 31 y.o. female with medical history of gestational diabetes, depression/anxiety who presented with worsening left upper quadrant abdominal pain, nausea and diarrhea.  Patient reports that started on Friday and has gotten worse.  Having loose stools 5-8 episodes per day along with nausea but no vomiting.  Tolerating p.o. diet okay.  Denies any fever or chills.  Does not have any member with similar symptoms.  Denies any gallstones in the past.     In the emergency room, patient was tachycardic to 130s and hypertensive 167/106.  Laboratory work-up is remarkable for for lactic acid of 3.7 with repeat of 3.9, WBC 15.4.  Chest x-ray without any acute infiltrate.  CT abdomen and pelvis shows left adnexal cyst.  Pelvis and transvaginal ultrasound shows 4.5 cm left ovarian cyst.  Urine dipstick negative for pregnancy and urinary tract infection.      Subjective & 24hr Events (05/04/22):   Patient is asymptomatic.  She had diarrhea last night.  She stated she is very anxious and wants to go home.  Family at bedside.  She was tachycardic.  Patient denies chest pain, shortness of breath, nausea, vomiting.       Assessment & Plan:   Sepsis  Unclear etiology. Patient criteria for sepsis with tachycardia and leukocytosis. Possible abdominal source given N with diarrhea  Chest x-ray and CT scan independently reviewed without any acute abnormalities suggestive of infection.  Urinalysis negative  -Follow-up Blood cultures  Discontinued- Zosyn and vancomycin   Started on Cipro Flagyl  -check Cdiff,  stool WBC, stool culture, stool O&P    Hypokalemia due to diarrhea and poor oral intake  K repleted  Follow-up BMP in a.m.     Sinus Tachycardia  EKG independently reviewed which showed sinus tachycardia  troponin and d dimer normal  Follow-up with UDS  Telemetry monitoring  Follow-up with echo     Hypertensive emergency  Resolved  S/p Labetalol IV 10 mg as needed  -No history of hypertension on antihypertensive medications at home     Type 2 diabetes/gestational diabetes  On metformin at home  -Hold oral metformin at this time.  Continue with insulin sliding scale     Depression/anxiety: Continue with Effexor\      Left ovarian cyst and renal cyst  Follow-up as outpatient     Obesity  Body mass index is 43.54 kg/m.  Increased risk of all cause mortality, complicating care  Dispo anticipate hospital stay for 1 to 2 days.     Hospital Problems:  Principal Problem:    Sepsis ([place])  Active Problems:    Type 2 diabetes mellitus, without long-term current use of insulin (H[facility])    Class 3 severe obesity due to excess calories in adult [facility])    Hypertensive emergency    Depression  Resolved Problems:    * No resolved hospital problems. *      Objective:   Patient Vitals for the past 24 hrs:   Temp Pulse Resp  BP SpO2   05/04/22 1131 98.9 F (37.2 C) (!) 101 16 (!) 135/91 97 %   05/04/22 0831 98.4 F (36.9 C) (!) 117 16 (!) 146/92 98 %   05/03/22 2325 98.2 F (36.8 C) (!) 119 16 128/81 92 %   05/03/22 2052 98.1 F (36.7 C) (!) 129 16 (!) 158/91 97 %   05/03/22 1840 -- 99 -- (!) 147/85 --   05/03/22 1709 98.2 F (36.8 C) (!) 137 -- (!) 142/101 98 %   05/03/22 1630 -- (!) 125 11 (!) 140/92 92 %   05/03/22 1600 -- (!) 129 15 (!) 155/103 96 %   05/03/22 1400 -- (!) 126 14 (!) 167/106 97 %   05/03/22 1335 -- (!) 127 19 (!) 155/104 99 %       Oxygen Therapy  SpO2: 97 %  Pulse via Oximetry: 127 beats per minute  O2 Device: None (Room air)    Estimated body mass index is 43.54 kg/m as calculated from the following:    Height  as of this encounter: 5' 7"  (1.702 m).    Weight as of this encounter: 278 lb (126.1 kg).  No intake or output data in the 24 hours ending 05/04/22 1247      Physical Exam:     General:    Well nourished.  Obese  Head:  Normocephalic, atraumatic  Eyes:  Sclerae appear normal.  Pupils equally round.  ENT:  Nares appear normal.  Moist oral mucosa  Neck:  No restricted ROM.  Trachea midline   CV:   RRR.  No m/r/g.  No jugular venous distension.  Lungs:   CTAB.  No wheezing, rhonchi, or rales.  Symmetric expansion.  Abdomen:   Soft, nontender, nondistended.  Extremities: No cyanosis or clubbing.  No edema  Skin:     No rashes and normal coloration.   Warm and dry.    Neuro:  CN II-XII grossly intact.    Psych:  Normal mood and affect.      I have personally reviewed labs and tests:  Recent Labs:  Recent Results (from the past 48 hour(s))   D-Dimer, Quantitative    Collection Time: 05/03/22 11:59 AM   Result Value Ref Range    D-Dimer, Quant <0.27 <0.56 ug/ml(FEU)   Procalcitonin    Collection Time: 05/03/22 11:59 AM   Result Value Ref Range    Procalcitonin <0.05 0.00 - 0.49 ng/mL   CBC with Diff    Collection Time: 05/03/22 12:00 PM   Result Value Ref Range    WBC 15.4 (H) 4.3 - 11.1 K/uL    RBC 5.07 4.05 - 5.2 M/uL    Hemoglobin 13.8 11.7 - 15.4 g/dL    Hematocrit 43.4 35.8 - 46.3 %    MCV 85.6 82.0 - 102.0 FL    MCH 27.2 26.1 - 32.9 PG    MCHC 31.8 31.4 - 35.0 g/dL    RDW 13.4 11.9 - 14.6 %    Platelets 621 (H) 150 - 450 K/uL    MPV 9.2 (L) 9.4 - 12.3 FL    nRBC 0.00 0.0 - 0.2 K/uL    Differential Type AUTOMATED      Neutrophils % 64 43 - 78 %    Lymphocytes % 27 13 - 44 %    Monocytes % 6 4.0 - 12.0 %    Eosinophils % 1 0.5 - 7.8 %    Basophils % 1 0.0 - 2.0 %  Immature Granulocytes 1 0.0 - 5.0 %    Neutrophils Absolute 9.9 (H) 1.7 - 8.2 K/UL    Lymphocytes Absolute 4.2 0.5 - 4.6 K/UL    Monocytes Absolute 0.9 0.1 - 1.3 K/UL    Eosinophils Absolute 0.2 0.0 - 0.8 K/UL    Basophils Absolute 0.1 0.0 - 0.2 K/UL     Absolute Immature Granulocyte 0.1 0.0 - 0.5 K/UL   CMP    Collection Time: 05/03/22 12:00 PM   Result Value Ref Range    Sodium 140 133 - 143 mmol/L    Potassium 4.1 3.5 - 5.1 mmol/L    Chloride 105 101 - 110 mmol/L    CO2 25 21 - 32 mmol/L    Anion Gap 10 2 - 11 mmol/L    Glucose 132 (H) 65 - 100 mg/dL    BUN 6 6 - 23 MG/DL    Creatinine 0.75 0.6 - 1.0 MG/DL    Est, Glom Filt Rate >60 >60 ml/min/1.25m    Calcium 9.7 8.3 - 10.4 MG/DL    Total Bilirubin 0.4 0.2 - 1.1 MG/DL    ALT 40 12 - 65 U/L    AST 29 15 - 37 U/L    Alk Phosphatase 68 50 - 130 U/L    Total Protein 8.2 6.3 - 8.2 g/dL    Albumin 3.7 3.5 - 5.0 g/dL    Globulin 4.5 2.8 - 4.5 g/dL    Albumin/Globulin Ratio 0.8 0.4 - 1.6     Lipase    Collection Time: 05/03/22 12:00 PM   Result Value Ref Range    Lipase 128 73 - 393 U/L   Troponin    Collection Time: 05/03/22 12:00 PM   Result Value Ref Range    Troponin, High Sensitivity 3.0 0 - 14 pg/mL   POC Pregnancy Urine Qual    Collection Time: 05/03/22 12:04 PM   Result Value Ref Range    Preg Test, Ur Negative NEG     Blood Culture 1    Collection Time: 05/03/22 12:56 PM    Specimen: Blood   Result Value Ref Range    Special Requests LEFT  Antecubital        Culture NO GROWTH AFTER 19 HOURS     Blood Culture 2    Collection Time: 05/03/22 12:56 PM    Specimen: Blood   Result Value Ref Range    Special Requests RIGHT  Antecubital        Culture NO GROWTH AFTER 19 HOURS     Lactate, Sepsis    Collection Time: 05/03/22 12:56 PM   Result Value Ref Range    Lactic Acid, Sepsis 3.7 (HH) 0.4 - 2.0 MMOL/L   Lactate, Sepsis    Collection Time: 05/03/22  3:15 PM   Result Value Ref Range    Lactic Acid, Sepsis 3.9 (HH) 0.4 - 2.0 MMOL/L   POCT Glucose    Collection Time: 05/03/22  5:31 PM   Result Value Ref Range    POC Glucose 126 (H) 65 - 100 mg/dL    Performed by: RavenellShakayaPCT    Culture, Stool    Collection Time: 05/03/22  7:11 PM    Specimen: Stool   Result Value Ref Range    Special Requests NO SPECIAL  REQUESTS      Culture        No growth after short period of incubation. Further results to follow after overnight incubation.   Lactic Acid  Collection Time: 05/03/22  7:45 PM   Result Value Ref Range    Lactic Acid, Plasma 3.7 (HH) 0.4 - 2.0 MMOL/L   POCT Glucose    Collection Time: 05/03/22  8:50 PM   Result Value Ref Range    POC Glucose 133 (H) 65 - 100 mg/dL    Performed by: WatersMeganRN    Lactic Acid    Collection Time: 05/04/22 12:06 AM   Result Value Ref Range    Lactic Acid, Plasma 2.3 (H) 0.4 - 2.0 MMOL/L   Basic Metabolic Panel w/ Reflex to MG    Collection Time: 05/04/22  4:49 AM   Result Value Ref Range    Sodium 140 133 - 143 mmol/L    Potassium 3.3 (L) 3.5 - 5.1 mmol/L    Chloride 108 101 - 110 mmol/L    CO2 22 21 - 32 mmol/L    Anion Gap 10 2 - 11 mmol/L    Glucose 109 (H) 65 - 100 mg/dL    BUN 4 (L) 6 - 23 MG/DL    Creatinine 0.62 0.6 - 1.0 MG/DL    Est, Glom Filt Rate >60 >60 ml/min/1.90m    Calcium 8.4 8.3 - 10.4 MG/DL   CBC with Auto Differential    Collection Time: 05/04/22  4:49 AM   Result Value Ref Range    WBC 12.6 (H) 4.3 - 11.1 K/uL    RBC 4.21 4.05 - 5.2 M/uL    Hemoglobin 11.6 (L) 11.7 - 15.4 g/dL    Hematocrit 35.6 (L) 35.8 - 46.3 %    MCV 84.6 82.0 - 102.0 FL    MCH 27.6 26.1 - 32.9 PG    MCHC 32.6 31.4 - 35.0 g/dL    RDW 13.2 11.9 - 14.6 %    Platelets 501 (H) 150 - 450 K/uL    MPV 9.2 (L) 9.4 - 12.3 FL    nRBC 0.00 0.0 - 0.2 K/uL    Neutrophils % 52 43 - 78 %    Lymphocytes % 41 13 - 44 %    Monocytes % 6 4.0 - 12.0 %    Eosinophils % 1 0.5 - 7.8 %    Basophils % 0 0.0 - 2.0 %    Immature Granulocytes 0 0.0 - 5.0 %    Neutrophils Absolute 6.5 1.7 - 8.2 K/UL    Lymphocytes Absolute 5.2 (H) 0.5 - 4.6 K/UL    Monocytes Absolute 0.8 0.1 - 1.3 K/UL    Eosinophils Absolute 0.1 0.0 - 0.8 K/UL    Basophils Absolute 0.0 0.0 - 0.2 K/UL    Absolute Immature Granulocyte 0.0 0.0 - 0.5 K/UL    RBC Comment SLIGHT  ANISOCYTOSIS + POIKILOCYTOSIS        WBC Comment Result Confirmed By Smear       Platelet Comment INCREASED      Differential Type AUTOMATED     Lactic Acid    Collection Time: 05/04/22  4:49 AM   Result Value Ref Range    Lactic Acid, Plasma 2.5 (H) 0.4 - 2.0 MMOL/L   Magnesium    Collection Time: 05/04/22  4:49 AM   Result Value Ref Range    Magnesium 1.9 1.8 - 2.4 mg/dL   POCT Glucose    Collection Time: 05/04/22  7:23 AM   Result Value Ref Range    POC Glucose 124 (H) 65 - 100 mg/dL    Performed by: SlaterShaniceADN    Lactic Acid  Collection Time: 05/04/22  9:21 AM   Result Value Ref Range    Lactic Acid, Plasma 3.5 (H) 0.4 - 2.0 MMOL/L   EKG 12 Lead    Collection Time: 05/04/22  9:46 AM   Result Value Ref Range    Ventricular Rate 129 BPM    Atrial Rate 129 BPM    P-R Interval 126 ms    QRS Duration 88 ms    Q-T Interval 336 ms    QTc Calculation (Bazett) 492 ms    P Axis 37 degrees    R Axis 96 degrees    T Axis 14 degrees    Diagnosis       Sinus tachycardia  Rightward axis  Borderline ECG  No previous ECGs available     POCT Glucose    Collection Time: 05/04/22 11:18 AM   Result Value Ref Range    POC Glucose 134 (H) 65 - 100 mg/dL    Performed by: Belva Bertin    Urine Drug Screen    Collection Time: 05/04/22 11:30 AM   Result Value Ref Range    PCP, Urine Negative      Benzodiazepines, Urine Negative      Cocaine, Urine Negative      Amphetamine, Urine Negative      Methadone, Urine Negative      THC, TH-Cannabinol, Urine Negative      Opiates, Urine Negative      Barbiturates, Urine Negative         Current Meds:  Current Facility-Administered Medications   Medication Dose Route Frequency    potassium chloride (KLOR-CON M) extended release tablet 40 mEq  40 mEq Oral BID WC    metoprolol tartrate (LOPRESSOR) tablet 25 mg  25 mg Oral BID    venlafaxine (EFFEXOR XR) extended release capsule 150 mg  150 mg Oral Daily    sodium chloride flush 0.9 % injection 5-40 mL  5-40 mL IntraVENous 2 times per day    sodium chloride flush 0.9 % injection 5-40 mL  5-40 mL IntraVENous PRN    0.9 %  sodium chloride infusion   IntraVENous PRN    potassium chloride (KLOR-CON M) extended release tablet 40 mEq  40 mEq Oral PRN    Or    potassium bicarb-citric acid (EFFER-K) effervescent tablet 40 mEq  40 mEq Oral PRN    Or    potassium chloride 10 mEq/100 mL IVPB (Peripheral Line)  10 mEq IntraVENous PRN    potassium chloride 10 mEq/100 mL IVPB (Peripheral Line)  10 mEq IntraVENous PRN    magnesium sulfate 2000 mg in 50 mL IVPB premix  2,000 mg IntraVENous PRN    ondansetron (ZOFRAN-ODT) disintegrating tablet 4 mg  4 mg Oral Q8H PRN    Or    ondansetron (ZOFRAN) injection 4 mg  4 mg IntraVENous Q6H PRN    polyethylene glycol (GLYCOLAX) packet 17 g  17 g Oral Daily PRN    bisacodyl (DULCOLAX) suppository 10 mg  10 mg Rectal Daily PRN    famotidine (PEPCID) tablet 10 mg  10 mg Oral Daily PRN    aluminum & magnesium hydroxide-simethicone (MAALOX) 200-200-20 MG/5ML suspension 30 mL  30 mL Oral Q6H PRN    acetaminophen (TYLENOL) tablet 650 mg  650 mg Oral Q6H PRN    Or    acetaminophen (TYLENOL) suppository 650 mg  650 mg Rectal Q6H PRN    enoxaparin Sodium (LOVENOX) injection 30 mg  30 mg SubCUTAneous BID  insulin lispro (HUMALOG) injection vial 0-8 Units  0-8 Units SubCUTAneous TID WC    insulin lispro (HUMALOG) injection vial 0-4 Units  0-4 Units SubCUTAneous Nightly    lactated ringers IV soln infusion   IntraVENous Continuous    labetalol (NORMODYNE;TRANDATE) injection 20 mg  20 mg IntraVENous Q4H PRN    vancomycin (VANCOCIN) 1500 mg in sodium chloride 0.9% 500 mL IVPB  1,500 mg IntraVENous Q12H    piperacillin-tazobactam (ZOSYN) 3,375 mg in sodium chloride 0.9 % 50 mL IVPB (mini-bag)  3,375 mg IntraVENous Q8H    melatonin tablet 6 mg  6 mg Oral Nightly PRN       Signed:  Humberto Leep, MD    Part of this note may have been written by using a voice dictation software.  The note has been proof read but may still contain some grammatical/other typographical errors.

## 2022-05-05 LAB — BASIC METABOLIC PANEL W/ REFLEX TO MG FOR LOW K
Anion Gap: 12 mmol/L — ABNORMAL HIGH (ref 2–11)
BUN: 4 MG/DL — ABNORMAL LOW (ref 6–23)
CO2: 24 mmol/L (ref 21–32)
Calcium: 9.1 MG/DL (ref 8.3–10.4)
Chloride: 105 mmol/L (ref 101–110)
Creatinine: 0.51 MG/DL — ABNORMAL LOW (ref 0.6–1.0)
Est, Glom Filt Rate: >60 mL/min/{1.73_m2} (ref >60)
Glucose: 119 mg/dL — ABNORMAL HIGH (ref 65–100)
Potassium: 4.4 mmol/L (ref 3.5–5.1)
Sodium: 141 mmol/L (ref 133–143)

## 2022-05-05 LAB — CBC WITH AUTO DIFFERENTIAL
Absolute Immature Granulocyte: 0 10*3/uL (ref 0.0–0.5)
Basophils %: 0 % (ref 0.0–2.0)
Basophils Absolute: 0 10*3/uL (ref 0.0–0.2)
Eosinophils %: 2 % (ref 0.5–7.8)
Eosinophils Absolute: 0.2 10*3/uL (ref 0.0–0.8)
Hematocrit: 38.2 % (ref 35.8–46.3)
Hemoglobin: 12 g/dL (ref 11.7–15.4)
Immature Granulocytes: 0 % (ref 0.0–5.0)
Lymphocytes %: 40 % (ref 13–44)
Lymphocytes Absolute: 4.6 10*3/uL (ref 0.5–4.6)
MCH: 27.2 PG (ref 26.1–32.9)
MCHC: 31.4 g/dL (ref 31.4–35.0)
MCV: 86.6 FL (ref 82.0–102.0)
MPV: 9.4 FL (ref 9.4–12.3)
Monocytes %: 6 % (ref 4.0–12.0)
Monocytes Absolute: 0.6 10*3/uL (ref 0.1–1.3)
Neutrophils %: 53 % (ref 43–78)
Neutrophils Absolute: 6.1 10*3/uL (ref 1.7–8.2)
Platelets: 494 10*3/uL — ABNORMAL HIGH (ref 150–450)
RBC: 4.41 M/uL (ref 4.05–5.2)
RDW: 13.4 % (ref 11.9–14.6)
WBC: 11.7 10*3/uL — ABNORMAL HIGH (ref 4.3–11.1)
nRBC: 0 10*3/uL (ref 0.0–0.2)

## 2022-05-05 LAB — HEMOGLOBIN A1C
Hemoglobin A1C: 6.6 % — ABNORMAL HIGH (ref 4.8–5.6)
eAG: 143 mg/dL

## 2022-05-05 LAB — POCT GLUCOSE
POC Glucose: 116 mg/dL — ABNORMAL HIGH (ref 65–100)
POC Glucose: 154 mg/dL — ABNORMAL HIGH (ref 65–100)
POC Glucose: 122 mg/dL — ABNORMAL HIGH (ref 65–100)

## 2022-05-05 LAB — PHOSPHORUS: Phosphorus: 4.1 MG/DL (ref 2.5–4.5)

## 2022-05-05 MED ORDER — GLIPIZIDE ER 5 MG PO TB24
5 MG | ORAL_TABLET | Freq: Every day | ORAL | 0 refills | Status: AC
Start: 2022-05-05 — End: 2022-06-04

## 2022-05-05 MED ORDER — METOPROLOL TARTRATE 25 MG PO TABS
25 MG | ORAL_TABLET | Freq: Two times a day (BID) | ORAL | 3 refills | Status: AC
Start: 2022-05-05 — End: 2022-11-16

## 2022-05-05 MED ORDER — METOPROLOL TARTRATE 50 MG PO TABS
50 MG | Freq: Two times a day (BID) | ORAL | Status: DC
Start: 2022-05-05 — End: 2022-05-05

## 2022-05-05 MED ORDER — ALPRAZOLAM 0.5 MG PO TABS
0.5 MG | ORAL_TABLET | Freq: Every evening | ORAL | 0 refills | Status: AC | PRN
Start: 2022-05-05 — End: 2022-05-10

## 2022-05-05 MED ORDER — CIPROFLOXACIN HCL 500 MG PO TABS
500 MG | ORAL_TABLET | Freq: Two times a day (BID) | ORAL | 0 refills | Status: AC
Start: 2022-05-05 — End: 2022-05-08

## 2022-05-05 MED ORDER — METRONIDAZOLE 500 MG PO TABS
500 MG | ORAL_TABLET | Freq: Three times a day (TID) | ORAL | 0 refills | Status: AC
Start: 2022-05-05 — End: 2022-05-11

## 2022-05-05 MED ORDER — METOPROLOL TARTRATE 25 MG PO TABS
25 MG | Freq: Two times a day (BID) | ORAL | Status: DC
Start: 2022-05-05 — End: 2022-05-05

## 2022-05-05 MED FILL — METRONIDAZOLE 500 MG/100ML IV SOLN: 500 MG/100ML | INTRAVENOUS | Qty: 100

## 2022-05-05 MED FILL — METOPROLOL TARTRATE 25 MG PO TABS: 25 MG | ORAL | Qty: 1

## 2022-05-05 MED FILL — ENOXAPARIN SODIUM 30 MG/0.3ML IJ SOSY: 30 MG/0.3ML | INTRAMUSCULAR | Qty: 0.3

## 2022-05-05 MED FILL — ALPRAZOLAM 0.5 MG PO TABS: 0.5 MG | ORAL | Qty: 1

## 2022-05-05 NOTE — Care Coordination-Inpatient (Signed)
Interdisciplinary team meeting with attending, CM, nursing, PT and nutritional services for plan of care . Patient medically stable for d/c today. CM met with patient prior to d/c. Patient voices no concerns or needs for d/c and is in agreement with d/c today. Patient d/c home with spouse.

## 2022-05-05 NOTE — Plan of Care (Signed)
Problem: Discharge Planning  Goal: Discharge to home or other facility with appropriate resources  Outcome: Progressing     Problem: Pain  Goal: Verbalizes/displays adequate comfort level or baseline comfort level  Outcome: Progressing

## 2022-05-05 NOTE — Discharge Summary (Signed)
Hospitalist Discharge Summary   Admit Date:  05/03/2022 11:53 AM   DC Note date: 05/05/2022  Name:  Sonya French   Age:  31 y.o.  Sex:  female  DOB:  03/24/1991   MRN:  [id number]   Room:  361/01  PCP:  Sharlene DoryICHARD G LELAND JR, MD    Presenting Complaint: Abdominal Pain     Initial Admission Diagnosis: Sinus tachycardia [R00.0]  Septicemia (HCC) [A41.9]  Sepsis (HCC) [A41.9]  Diarrhea, unspecified type [R19.7]  Left lower quadrant abdominal pain [R10.32]     Problem List for this Hospitalization (present on admission):    Principal Problem:    Sepsis (HCC)  Active Problems:    Type 2 diabetes mellitus, without long-term current use of insulin (HCC)    Class 3 severe obesity due to excess calories in adult [facility](HCC)    Hypertensive emergency    Depression  Resolved Problems:    * No resolved hospital problems. *      Hospital Course:  Sonya SillsJaden Shon is a 31 y.o. female with medical history of gestational diabetes, depression/anxiety who presented with worsening left upper quadrant abdominal pain, nausea and diarrhea.      In the emergency room, patient was tachycardic to 130s and hypertensive 167/106.  Laboratory work-up is remarkable for for lactic acid of 3.7 with repeat of 3.9, WBC 15.4.  Chest x-ray without any acute infiltrate.  CT abdomen and pelvis shows left adnexal cyst.  Pelvis and transvaginal ultrasound shows 4.5 cm left ovarian cyst.  Urine dipstick negative for pregnancy and urinary tract infection. Chest x-ray without any acute abnormalities suggestive of infection. Blood cultures and stool cultures negative.  She was treated empirically with IV antibiotics.  C. difficile negative.  Patient clinically improved and is hemodynamically stable for discharge.    Hypokalemia   Resolved with K supplementation    Sinus Tachycardia  EKG independently reviewed which showed sinus tachycardia  troponin and d dimer normal  UDS negative   echo with normal ejection fraction  Started low-dose metoprolol     Hypertensive  emergency  Resolved  S/p Labetalol IV 10 mg as needed  -No history of hypertension on antihypertensive medications at home     Type 2 diabetes/gestational diabetes  Discontinued metformin due to diarrhea and lactic acidosis  Started on glipizide low-dose     Depression/anxiety: Continue with Effexor\        Left ovarian cyst and renal cyst  Follow-up as outpatient with PCP     Obesity  Body mass index is 43.54 kg/m.  Increased risk of all cause mortality, complicating care  Counseled regarding weight loss and exercise    Disposition: \Home  Diet: ADULT DIET; Regular; 5 carb choices (75 gm/meal)  Code Status: Full Code    Follow Ups:      Time spent in patient discharge and coordination 37 minutes.        Follow up labs/diagnostics (ultimately defer to outpatient provider):  Follow-up with PCP in 3 to 5 days  Plan was discussed with patient.  All questions answered.  Patient was stable at time of discharge.  Instructions given to call a physician or return if any concerns.    Current Discharge Medication List        START taking these medications    Details   ALPRAZolam (XANAX) 0.5 MG tablet Take 1 tablet by mouth nightly as needed for Anxiety for up to 5 days. Max Daily Amount: 0.5 mg  Qty: 10 tablet,  Refills: 0    Associated Diagnoses: Anxiety      metoprolol tartrate (LOPRESSOR) 25 MG tablet Take 1 tablet by mouth 2 times daily  Qty: 60 tablet, Refills: 3      glipiZIDE (GLUCOTROL XL) 5 MG extended release tablet Take 1 tablet by mouth daily  Qty: 30 tablet, Refills: 0      ciprofloxacin (CIPRO) 500 MG tablet Take 1 tablet by mouth 2 times daily for 3 days  Qty: 6 tablet, Refills: 0      metroNIDAZOLE (FLAGYL) 500 MG tablet Take 1 tablet by mouth 3 times daily for 6 days  Qty: 18 tablet, Refills: 0           CONTINUE these medications which have NOT CHANGED    Details   venlafaxine (EFFEXOR XR) 150 MG extended release capsule Take 1 capsule by mouth daily  Qty: 30 capsule, Refills: 11      norgestimate-ethinyl  estradiol (ORTHO-CYCLEN) 0.25-35 MG-MCG per tablet            STOP taking these medications       metFORMIN (GLUCOPHAGE) 500 MG tablet Comments:   Reason for Stopping:               Some medications may have been reported old/obsolete and marked "stop taking" by the system; in reality pt was already off these meds; defer to outpatient or prescribing providers.    Procedures done this admission:  * No surgery found *    Consults this admission:  IP CONSULT TO PHARMACY    Echocardiogram results:  05/03/22    TRANSTHORACIC ECHOCARDIOGRAM (TTE) COMPLETE (CONTRAST/BUBBLE/3D PRN) 05/04/2022  2:38 PM (Final)    Interpretation Summary    Left Ventricle: Normal left ventricular systolic function with a visually estimated EF of 60 - 65%. Left ventricle size is normal. Normal wall thickness. Normal wall motion. Normal diastolic function.    Mitral Valve: Mild regurgitation.    Tricuspid Valve: Trace regurgitation. The estimated RVSP is 22 mmHg.    Signed by: Hendricks Limes, MD on 05/04/2022  2:38 PM      Diagnostic Imaging/Tests:   CT ABDOMEN PELVIS W IV CONTRAST Additional Contrast? None    Result Date: 05/03/2022  4.6 cm left adnexal cyst.     XR CHEST PORTABLE    Result Date: 05/03/2022  Unremarkable portable chest radiograph.     US PELVIS COMPLETE NON-OB TRANSABDOMINAL AND TRANSVAGINAL    Result Date: 05/03/2022  1. 4.5 cm left ovarian cyst. 2. Nonvisualization of the right ovary.       Labs: Results:       BMP, Mg, Phos Recent Labs     05/03/22  1200 05/04/22  0449 05/05/22  0617   NA 140 140 141   K 4.1 3.3* 4.4   CL 105 108 105   CO2 ANIONGAP 10 10 12*   BUN 6 4* 4*   CREATININE 0.75 0.62 0.51*   LABGLOM >60 >60 >60   CALCIUM 9.7 8.4 9.1   GLUCOSE 132* 109* 119*   MG  --  1.9  --    PHOS  --   --  4.1      CBC Recent Labs     05/03/22  1200 05/04/22  0449 05/05/22  0617   WBC 15.4* 12.6* 11.7*   RBC 5.07 4.21 4.41   HGB 13.8 11.6* 12.0   HCT 43.4 35.6* 38.2   MCV 85.6 84.6 86.6  MCH 27.2 27.6 27.2   MCHC  31.8 32.6 31.4   RDW 13.4 13.2 13.4   PLT 621* 501* 494*   MPV 9.2* 9.2* 9.4   NRBC 0.00 0.00 0.00   LYMPHOPCT 27 41 40   EOSRELPCT MONOPCT BASOPCT 1 0 0   IMMGRAN 1 0 0   LYMPHSABS 4.2 5.2* 4.6   EOSABS 0.2 0.1 0.2   MONOSABS 0.9 0.8 0.6   BASOSABS 0.1 0.0 0.0   ABSIMMGRAN 0.1 0.0 0.0      LFT Recent Labs     05/03/22  1200   BILITOT 0.4   ALKPHOS 68   AST 29   ALT 40   PROT 8.2   LABALBU 3.7   GLOB 4.5      Cardiac  Lab Results   Component Value Date/Time    TROPHS 3.0 05/03/2022 12:00 PM      Coags No results found for: PROTIME, INR, APTT   A1c No results found for: LABA1C, EAG   Lipids No results found for: CHOL, LDLCALC, LABVLDL, HDL, CHOLHDLRATIO, TRIG   Thyroid  No results found for: TSHELE, TSH3GEN     Most Recent UA No results found for: COLORU, APPEARANCE, SPECGRAV, LABPH, PROTEINU, GLUCOSEU, KETUA, BILIRUBINUR, BLOODU, UROBILINOGEN, NITRU, LEUKOCYTESUR, WBCUA, RBCUA, EPITHUA, BACTERIA, LABCAST, MUCUS     Recent Labs     05/03/22  1911 05/03/22  1256   CULTURE NO GROWTH OF SALMONELLA OR SHIGELLA IS EVIDENT ON FIRST READING, FINAL REPORT TO FOLLOW AFTER FURTHER INCUBATION OF CULTURE NO GROWTH 2 DAYS  NO GROWTH 2 DAYS       All Labs from Last 24 Hrs:  Recent Results (from the past 24 hour(s))   POCT Glucose    Collection Time: 05/04/22 11:18 AM   Result Value Ref Range    POC Glucose 134 (H) 65 - 100 mg/dL    Performed by: Kym Groom    Urine Drug Screen    Collection Time: 05/04/22 11:30 AM   Result Value Ref Range    PCP, Urine Negative      Benzodiazepines, Urine Negative      Cocaine, Urine Negative      Amphetamine, Urine Negative      Methadone, Urine Negative      THC, TH-Cannabinol, Urine Negative      Opiates, Urine Negative      Barbiturates, Urine Negative     Transthoracic echocardiogram (TTE) complete with contrast, bubble, strain, and 3D PRN    Collection Time: 05/04/22 12:00 PM   Result Value Ref Range    LV EDV A2C 119 mL    LV EDV A4C 133 mL    LV ESV A2C 50 mL    LV ESV  A4C 46 mL    IVSd 1.0 (A) 0.6 - 0.9 cm    LVIDd 4.3 3.9 - 5.3 cm    LVIDs 3.0 cm    LVOT Diameter 2.2 cm    LVOT Mean Gradient 4 mmHg    LVOT VTI 23.0 cm    LVOT Peak Velocity 1.2 m/s    LVOT Peak Gradient 5 mmHg    LVPWd 1.0 (A) 0.6 - 0.9 cm    LV E' Lateral Velocity 12 cm/s    LV E' Septal Velocity 10 cm/s    LV Ejection Fraction A2C 58 %    LV Ejection Fraction A4C 66 %    EF BP 63 55 - 100 %  LVOT Area 3.8 cm2    LVOT SV 87.4 ml    LA Minor Axis 5.0 cm    LA Major Axis 5.5 cm    LA Area 2C 15.2 cm2    LA Area 4C 16.1 cm2    LA Volume 2C 38 22 - 52 mL    LA Volume 4C 37 22 - 52 mL    LA Volume BP 39 22 - 52 mL    LA Diameter 3.6 cm    AV Mean Velocity 1.0 m/s    AV Mean Gradient 4 mmHg    AV VTI 24.3 cm    AV Peak Velocity 1.4 m/s    AV Peak Gradient 8 mmHg    AV Area by VTI 3.6 cm2    AV Area by Peak Velocity 3.2 cm2    Aortic Root 3.4 cm    Ascending Aorta 3.4 cm    IVC Expiration 1.8 cm    MV E Wave Deceleration Time 126.0 ms    MV A Velocity 1.17 m/s    MV E Velocity 0.95 m/s    PV AT 108.0 ms    PV Max Velocity 1.1 m/s    PV Peak Gradient 4 mmHg    RV Free Wall Peak S' 13 cm/s    TAPSE 2.1 1.7 cm    TR Max Velocity 2.16 m/s    TR Peak Gradient 19 mmHg    Body Surface Area 2.44 m2    Fractional Shortening 2D 30 28 - 44 %    LV ESV Index A4C 20 mL/m2    LV EDV Index A4C 57 mL/m2    LV ESV Index A2C 21 mL/m2    LV EDV Index A2C 51 mL/m2    LVIDd Index 1.85 cm/m2    LVIDs Index 1.29 cm/m2    LV RWT Ratio 0.47     LV Mass 2D 142.5 67 - 162 g    LV Mass 2D Index 61.2 43 - 95 g/m2    MV E/A 0.81     E/E' Ratio (Averaged) 8.71     E/E' Lateral 7.92     E/E' Septal 9.50     LA Volume Index BP 17 16 - 34 ml/m2    LVOT Stroke Volume Index 37.5 mL/m2    LA Volume Index 2C 16 16 - 34 mL/m2    LA Volume Index 4C 16 16 - 34 mL/m2    LA Size Index 1.55 cm/m2    LA/AO Root Ratio 1.06     Ao Root Index 1.46 cm/m2    Ascending Aorta Index 1.46 cm/m2    AV Velocity Ratio 0.86     LVOT:AV VTI Index 0.95     AVA/BSA VTI 1.5  cm2/m2    AVA/BSA Peak Velocity 1.4 cm2/m2    Est. RA Pressure 3 mmHg    RVSP 22 mmHg   Lactate, Sepsis    Collection Time: 05/04/22  1:59 PM   Result Value Ref Range    Lactic Acid, Sepsis 1.4 0.4 - 2.0 MMOL/L   Lactate, Sepsis    Collection Time: 05/04/22  3:22 PM   Result Value Ref Range    Lactic Acid, Sepsis 1.9 0.4 - 2.0 MMOL/L   POCT Glucose    Collection Time: 05/04/22  4:10 PM   Result Value Ref Range    POC Glucose 131 (H) 65 - 100 mg/dL    Performed by: Talbert Cage    POCT Glucose  Collection Time: 05/04/22  8:20 PM   Result Value Ref Range    POC Glucose 116 (H) 65 - 100 mg/dL    Performed by: [facility]    Basic Metabolic Panel w/ Reflex to MG    Collection Time: 05/05/22  6:17 AM   Result Value Ref Range    Sodium 141 133 - 143 mmol/L    Potassium 4.4 3.5 - 5.1 mmol/L    Chloride 105 101 - 110 mmol/L    CO2 24 21 - 32 mmol/L    Anion Gap 12 (H) 2 - 11 mmol/L    Glucose 119 (H) 65 - 100 mg/dL    BUN 4 (L) 6 - 23 MG/DL    Creatinine 3.00 (L) 0.6 - 1.0 MG/DL    Est, Glom Filt Rate >60 >60 ml/min/1.78m2    Calcium 9.1 8.3 - 10.4 MG/DL   CBC with Auto Differential    Collection Time: 05/05/22  6:17 AM   Result Value Ref Range    WBC 11.7 (H) 4.3 - 11.1 K/uL    RBC 4.41 4.05 - 5.2 M/uL    Hemoglobin 12.0 11.7 - 15.4 g/dL    Hematocrit 92.3 30.0 - 46.3 %    MCV 86.6 82.0 - 102.0 FL    MCH 27.2 26.1 - 32.9 PG    MCHC 31.4 31.4 - 35.0 g/dL    RDW 76.2 26.3 - 33.5 %    Platelets 494 (H) 150 - 450 K/uL    MPV 9.4 9.4 - 12.3 FL    nRBC 0.00 0.0 - 0.2 K/uL    Differential Type AUTOMATED      Neutrophils % 53 43 - 78 %    Lymphocytes % 40 13 - 44 %    Monocytes % 6 4.0 - 12.0 %    Eosinophils % 2 0.5 - 7.8 %    Basophils % 0 0.0 - 2.0 %    Immature Granulocytes 0 0.0 - 5.0 %    Neutrophils Absolute 6.1 1.7 - 8.2 K/UL    Lymphocytes Absolute 4.6 0.5 - 4.6 K/UL    Monocytes Absolute 0.6 0.1 - 1.3 K/UL    Eosinophils Absolute 0.2 0.0 - 0.8 K/UL    Basophils Absolute 0.0 0.0 - 0.2 K/UL    Absolute Immature  Granulocyte 0.0 0.0 - 0.5 K/UL   Phosphorus    Collection Time: 05/05/22  6:17 AM   Result Value Ref Range    Phosphorus 4.1 2.5 - 4.5 MG/DL   POCT Glucose    Collection Time: 05/05/22  8:22 AM   Result Value Ref Range    POC Glucose 154 (H) 65 - 100 mg/dL    Performed by: GillespieStefaniStudent        No Known Allergies    There is no immunization history on file for this patient.    Recent Vital Data:  Patient Vitals for the past 24 hrs:   Temp Pulse Resp BP SpO2   05/05/22 0825 99 F (37.2 C) 92 17 (!) 132/97 94 %   05/05/22 0306 98.2 F (36.8 C) 89 18 128/87 100 %   05/04/22 2319 98.6 F (37 C) 91 18 (!) 142/89 98 %   05/04/22 2022 99.3 F (37.4 C) (!) 120 22 (!) 150/97 97 %   05/04/22 1610 98.6 F (37 C) (!) 109 20 (!) 149/95 97 %   05/04/22 1131 98.9 F (37.2 C) (!) 101 16 (!) 135/91 97 %  Oxygen Therapy  SpO2: 94 %  Pulse via Oximetry: 127 beats per minute  O2 Device: None (Room air)    Estimated body mass index is 43.54 kg/m as calculated from the following:    Height as of this encounter:  (1.702 m).    Weight as of this encounter: 278 lb (126.1 kg).  No intake or output data in the 24 hours ending 05/05/22 1022      Physical Exam:    General:    Well nourished.  No overt distress, obese  Head:  Normocephalic, atraumatic  Eyes:  Sclerae appear normal.  Pupils equally round.    HENT:  Nares appear normal, no drainage.  Moist mucous membranes  Neck:  No restricted ROM.  Trachea midline  CV:   RRR.  No m/r/g.  No JVD  Lungs:   CTAB.  No wheezing, rhonchi, or rales.  Respirations even, unlabored  Abdomen:   Soft, nontender, nondistended.    Extremities: Warm and dry.  No cyanosis or clubbing.  No edema.    Skin:     No rashes.  Normal coloration  Neuro:  CN II-XII grossly intact.  Psych:  Normal mood and affect.    Signed:  Verdis Prime, MD    Part of this note may have been written by using a voice dictation software.  The note has been proof read but may still contain some grammatical/other  typographical errors.

## 2022-05-05 NOTE — Plan of Care (Signed)
Problem: Discharge Planning  Goal: Discharge to home or other facility with appropriate resources  05/05/2022 1202 by Dan Humphreys, RN  Outcome: Adequate for Discharge  05/05/2022 1124 by Dan Humphreys, RN  Outcome: Progressing     Problem: Pain  Goal: Verbalizes/displays adequate comfort level or baseline comfort level  05/05/2022 1202 by Dan Humphreys, RN  Outcome: Adequate for Discharge  05/05/2022 1124 by Dan Humphreys, RN  Outcome: Progressing

## 2022-05-05 NOTE — Progress Notes (Signed)
Patient ready for discharge, education completed. IV removed. Catheter tip intact. Dry dressing applied. Discharge explained.

## 2022-05-07 LAB — CULTURE, STOOL

## 2022-05-08 LAB — CULTURE, BLOOD 2: Culture: NO GROWTH

## 2022-05-08 LAB — CULTURE, BLOOD 1: Culture: NO GROWTH

## 2022-05-09 LAB — OVA AND PARASITE EXAMINATION

## 2022-05-09 LAB — OVA & PARASITES, STOOL, REFLEX

## 2022-05-20 NOTE — Telephone Encounter (Signed)
Patient having Oral surgery on TBD under IV sedation. Requesting   Cardiac clearance and any medication hold. Fax: [phone]

## 2022-05-23 NOTE — Telephone Encounter (Signed)
Spoke with Sonya French Client at [facility]. Informed that patient has a consult visit with [place] on 06/22/22 and will address clearance at the visit.

## 2022-06-22 ENCOUNTER — Institutional Professional Consult (permissible substitution): Admit: 2022-06-22 | Discharge: 2022-06-22 | Payer: BLUE CROSS/BLUE SHIELD | Primary: Family Medicine

## 2022-06-22 DIAGNOSIS — I161 Hypertensive emergency: Secondary | ICD-10-CM

## 2022-06-22 NOTE — Telephone Encounter (Signed)
Letter created and faxed based on verbal orders given to this MA

## 2022-06-22 NOTE — Progress Notes (Signed)
[facility]  [address], [address]  [place], [place] [address]  PHONE: [phone]      06/22/22    NAME:  Sonya French  DOB: 12/25/1990  MRN: [id number]         SUBJECTIVE:   Sonya French is a 31 y.o. female seen for a consultation visit regarding the following:     Chief Complaint   Patient presents with    Consultation    Hypertension    Cardiac Clearance     Tooth Removal - [facility]            HPI:  Consultation is requested by Sharlene Dory, MD for evaluation of Consultation, Hypertension, and Cardiac Clearance (Tooth Removal - [facility] for Oral [facility] Facial sug)   .    [name] presents today for follow-up.  She is complaining of some tachycardia.  She reports since her teenage years, she had elevated heart rates.  She was seen in the ER for abdominal pain, noted to have elevated heart rates and blood pressure.  Was started on metoprolol.  She also had an echocardiogram while in the hospital which showed normal LV function, no significant valve abnormalities.  She has continued to have some intermittent tachycardia.  Metoprolol is not made much of a difference.  Her lab work-up showed no evidence of anemia, normal thyroid function, normal renal function.  There is no significant history of heart disease.  She is a non-smoker.  She drinks 2 to 3 cups of coffee a day.    Hypertension      Key CAD CHF Meds            metoprolol tartrate (LOPRESSOR) 25 MG tablet (Taking)    Sig - Route: Take 1 tablet by mouth 2 times daily - Oral    Patient taking differently: Take 1 tablet by mouth daily    Class: Print          Key Antihyperglycemic Medications            glipiZIDE (GLUCOTROL XL) 5 MG extended release tablet (Taking)    Sig - Route: Take 1 tablet by mouth daily - Oral    Class: Print              Past Medical History, Past Surgical History, Family history, Social History, and Medications were all reviewed with the patient today and updated as  necessary.     Prior to Admission medications    Medication Sig Start Date End Date Taking? Authorizing Provider   metoprolol tartrate (LOPRESSOR) 25 MG tablet Take 1 tablet by mouth 2 times daily  Patient taking differently: Take 1 tablet by mouth daily 05/05/22  Yes Verdis Prime, MD   glipiZIDE (GLUCOTROL XL) 5 MG extended release tablet Take 1 tablet by mouth daily 05/05/22 06/22/22 Yes Verdis Prime, MD   venlafaxine (EFFEXOR XR) 150 MG extended release capsule Take 1 capsule by mouth daily 01/25/22 01/25/23 Yes Historical Provider, MD   norgestimate-ethinyl estradiol (ORTHO-CYCLEN) 0.25-35 MG-MCG per tablet  01/24/17  Yes Historical Provider, MD     No Known Allergies  Past Medical History:   Diagnosis Date    Depression     Gestational diabetes      History reviewed. No pertinent surgical history.  History reviewed. No pertinent family history.  Social History     Tobacco Use    Smoking status: Never    Smokeless tobacco: Never  Substance Use Topics    Alcohol use: Never       ROS:    Review of Systems   All other systems reviewed and are negative.       PHYSICAL EXAM:   BP 128/84   Pulse (!) 120   Ht 5\' 7"  (1.702 m)   Wt 273 lb (123.8 kg)   BMI 42.76 kg/m      Physical Exam  Vitals reviewed.   Constitutional:       Appearance: Normal appearance.      Comments: Appears younger than stated age   HENT:      Head: Normocephalic and atraumatic.   Eyes:      General: No scleral icterus.  Neck:      Vascular: No carotid bruit.   Cardiovascular:      Rate and Rhythm: Normal rate and regular rhythm.      Heart sounds: No murmur heard.    No gallop.   Pulmonary:      Breath sounds: Normal breath sounds.   Abdominal:      Palpations: Abdomen is soft.   Musculoskeletal:         General: No swelling.      Cervical back: Neck supple.   Skin:     General: Skin is warm and dry.   Neurological:      Mental Status: She is alert and oriented to person, place, and time.   Psychiatric:         Mood and Affect: Mood normal.        Medical problems and test results were reviewed with the patient today.     DATA REVIEW    BMP  Lab Results   Component Value Date/Time    NA 141 05/05/2022 06:17 AM    K 4.4 05/05/2022 06:17 AM    CL 105 05/05/2022 06:17 AM    CO2 24 05/05/2022 06:17 AM    BUN 4 05/05/2022 06:17 AM    CREATININE 0.51 05/05/2022 06:17 AM    GLUCOSE 119 05/05/2022 06:17 AM    CALCIUM 9.1 05/05/2022 06:17 AM        LIPIDS  No results found for: CHOL  No results found for: TRIG  No results found for: HDL  No results found for: LDLCHOLESTEROL, LDLCALC  No results found for: LABVLDL, VLDL  No results found for: CHOLHDLRATIO    EKG  Sinus tachycardia        OUTSIDE RECORDS REVIEW    Records from outside providers have been reviewed and summarized as noted in the HPI, past history and data review sections of this note, and reviewed with patient. .       ASSESSMENT and PLAN    Sonya was seen today for consultation, hypertension and cardiac clearance.    Diagnoses and all orders for this visit:    Hypertensive emergency  -     EKG 12 lead    Sinus tachycardia          IMPRESSION:    Ms. French presents today for follow-up.  She has had sinus tachycardia going back many years.  Heart rates in the system show anywhere from 90-1 20.  She has had no other real cardiac symptoms.  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  I have asked her to keep a log of her heart rates, would like to see her average rate be somewhere around 100 bpm.  We will discontinue her metoprolol as it does not seem to be helping her very much and is making her somewhat dizzy.  We will have her come back in 4 months.    Return in about 4 months (around 10/23/2022).            Thank you for allowing me to participate in this patient's care.  Please call or contact me if there are any questions or concerns regarding the above.      Rodney Booze III, MD  06/22/22  11:58 AM

## 2022-11-09 ENCOUNTER — Encounter: Payer: BLUE CROSS/BLUE SHIELD | Attending: Obstetrics & Gynecology | Primary: Family Medicine

## 2022-11-16 ENCOUNTER — Ambulatory Visit
Admit: 2022-11-16 | Discharge: 2022-11-16 | Payer: BLUE CROSS/BLUE SHIELD | Attending: Obstetrics & Gynecology | Primary: Family Medicine

## 2022-11-16 DIAGNOSIS — Z01419 Encounter for gynecological examination (general) (routine) without abnormal findings: Secondary | ICD-10-CM

## 2022-11-16 NOTE — Progress Notes (Signed)
Annual Exam  New pt        Sonya French   31 y.o.  1991-09-08  [id number]    Today:  11/16/22    Patient requests:   well woman annual exam.  Reports:   Monthly menses, wants birth control.  Used Sprintec in the past and liked it, did well with it.    BC:   none.    Past Medical History:   Diagnosis Date    Depression     Gestational diabetes        History reviewed. No pertinent surgical history.    History reviewed. No pertinent family history.    OB History   Gravida Para Term Preterm AB Living   1 1 1  0 0 1   SAB IAB Ectopic Molar Multiple Live Births   0 0 0 0 0 1      # Outcome Date GA Lbr Len/2nd Weight Sex Delivery Anes PTL Lv   1 Term 12/27/16 [redacted]w[redacted]d 18:40 / 00:19 3.34 kg (7 lb 5.8 oz) F Vag-Spont EPI  LIV      Name: ROAN, SAWCHUK      Apgar1: 8  Apgar5: 9      Obstetric Comments   12/2016, svd, daughter       Social History     Socioeconomic History    Marital status: Married     Spouse name: None    Number of children: None    Years of education: None    Highest education level: None   Tobacco Use    Smoking status: Never    Smokeless tobacco: Never   Vaping Use    Vaping Use: Never used   Substance and Sexual Activity    Alcohol use: Never    Drug use: Never    Sexual activity: Yes     Partners: Male     Comment: Husband     Social Determinants of Health     Financial Resource Strain: Low Risk  (11/06/2022)    Overall Financial Resource Strain (CARDIA)     Difficulty of Paying Living Expenses: Not very hard   Transportation Needs: Unknown (11/06/2022)    PRAPARE - Transportation     Lack of Transportation (Non-Medical): No   Housing Stability: Unknown (11/06/2022)    Housing Stability Vital Sign     Unstable Housing in the Last Year: No       Patient Active Problem List    Diagnosis Date Noted    Panic attack 06/02/2022    Sepsis ([place]) 05/03/2022    Type 2 diabetes mellitus ([place]) 05/03/2022     Formatting of this note might be different from the original. Diagnosed in 2023 Metformin caused GI issues.       Class 3 severe obesity due to excess calories in adult [facility]) 05/03/2022    Hypertensive emergency 05/03/2022    Depression 05/03/2022    Gestational diabetes mellitus 12/27/2016     Pt was on metformin prior to preg for an elevated HgbA1C. She is still on these meds. Should consider her a diabetic.      Abdominal pain, epigastric 05/29/2013    Anxiety state 05/29/2013        Current Outpatient Medications   Medication Sig    glipiZIDE (GLUCOTROL) 5 MG tablet Take 1 tablet by mouth 2 times daily (before meals)    norgestimate-ethinyl estradiol (SPRINTEC 28) 0.25-35 MG-MCG per tablet Take 1 tablet by mouth daily    venlafaxine (EFFEXOR XR)  150 MG extended release capsule Take 1 capsule by mouth daily    glipiZIDE (GLUCOTROL XL) 5 MG extended release tablet Take 1 tablet by mouth daily     No current facility-administered medications for this visit.       Review of Systems      Updated from patient's "Review of Systems" form that patient completed.       Physical Exam:   BP 121/78   Ht 1.702 m (5\' 7" )   Wt 122.7 kg (270 lb 9.6 oz)   LMP 11/04/2022   BMI 42.38 kg/m , Patient's last menstrual period was 11/04/2022.    Patient is a 31 y.o. female who appears pleasant, in no apparent distress, her given age, well developed, well nourished and with good attention to hygiene and body habitus.   Oriented to person, place and time.   Mood and affect normal and appropriate to situation.   Head is normocephalic, atraumatic, without any gross head or neck masses.   Neck: Neck exam reveals no abnormalities.   Thyroid difficult exam secondary to [facility], no abnormalities palpated..   Lymph nodes:   Neck lymph nodes are normal.   No supraclavicular lymphadenopathy noted.   No axillary lymphadenopathy noted.   No groin lymphadenopathy noted.   Respiratory: Assessment of respiratory effort reveals even and non labored respirations.  Lungs CTA.   Cardiovascular: Heart auscultation reveals no murmurs, gallop, rubs or clicks.   Skin: No  skin rash, abnormal appearing nevi, subcutaneous nodules, lesions or ulcers observed.   Abdomen: Abdomen soft, non tender, without palpable masses.   Palpation of liver reveals no abnormalities with respect to size, tenderness or masses.   Palpation of spleen reveals no abnormalities with respect to size, tenderness or masses.   Breast: Symmetrical, no: dimpling, retractions, adenopathy, dominant masses or nipple discharge elicited.    Breasts show no palpable masses or tenderness.   Bilateral Fibrocystic change is:  marked    No changes suspicious for malignancy      Genitourinary:  External genitalia are normal in appearance.   Examination of urethral meatus reveals location normal and size normal.   Examination of urethra shows no abnormalities.   Examination of vaginal vault reveals no abnormalities.   Cervix: shows no pathology.  Uterus:  Normal size, shape and contour.  Suboptimal bimanual exam secondary to BMI    Adnexa and parametria show no masses, tenderness, organomegaly or nodularity.  Suboptimal bimanual exam secondary to BMI    Examination of anus and perineum shows no abnormalities.     ASSESSMENT and PLAN    1.  AE.  Importance of self breast exam reviewed, pt educated how to perform SBE.      2.  Her PCP is [facility] [place]       3.   Has not had a COVID-vaccine  Is unsure about the HPV vaccine.  Encouraged her to check with her previous PCP/pediatrician if she had the HPV vaccine it should be on her record.    Immunization History   Administered Date(s) Administered    Influenza, FLUCELVAX, (age 23 mo+), MDCK, PF, 0.23mL 09/02/2016    TDaP, ADACEL (age 19y-64y), 6y-71y (age 10y+), IM, 0.79mL 09/30/2016     4. No h/o abnormal paps. Patient requests pap today.      5.  Her daughter is 43 yo    Re ROS--> non gynecologic concerns referred back to her PCP or appropriate specialist.     Wt  Readings from Last 3 Encounters:  Wt Readings from Last 3 Encounters:   11/16/22 122.7 kg (270 lb 9.6 oz)    06/22/22 123.8 kg (273 lb)   05/03/22 126.1 kg (278 lb)       BP Readings from Last 3 Encounters:  BP Readings from Last 3 Encounters:   11/16/22 121/78   06/22/22 128/84   05/05/22 (!) 132/97         Diagnosis Orders   1. Well woman exam with routine gynecological exam  PAP IG, Liquid-Based Rfx Aptima HPV when ASC-U, ASC-H, LSIL, HSIL, AGUS      2. Screening for cervical cancer  PAP IG, Liquid-Based Rfx Aptima HPV when ASC-U, ASC-H, LSIL, HSIL, AGUS      3. Screening for HPV (human papillomavirus)  PAP IG, Liquid-Based Rfx Aptima HPV when ASC-U, ASC-H, LSIL, HSIL, AGUS      4. Encounter for initial prescription of contraceptive pills  norgestimate-ethinyl estradiol (SPRINTEC 28) 0.25-35 MG-MCG per tablet          Orders Placed This Encounter   Procedures    PAP IG, Liquid-Based Rfx Aptima HPV when ASC-U, ASC-H, LSIL, HSIL, AGUS     Dictated using voice recognition software.  Proofread but unrecognized errors may exist.    Signed by:  Georgia Lopes, MD, [facility]

## 2022-11-17 MED ORDER — NORGESTIMATE-ETH ESTRADIOL 0.25-35 MG-MCG PO TABS
PACK | Freq: Every day | ORAL | 4 refills | Status: AC
Start: 2022-11-17 — End: ?

## 2022-11-21 LAB — PAP IG, LIQUID-BASED RFX APTIMA HPV WHEN ASC-U, ASC-H, LSIL, HSIL, AGUS
Date of LMP: 20231201
Prev Cyto Info: NEGATIVE

## 2023-02-01 ENCOUNTER — Ambulatory Visit: Admit: 2023-02-01 | Payer: BLUE CROSS/BLUE SHIELD | Attending: Obstetrics & Gynecology | Primary: Family Medicine

## 2023-02-01 DIAGNOSIS — Z5321 Procedure and treatment not carried out due to patient leaving prior to being seen by health care provider: Secondary | ICD-10-CM

## 2023-02-01 NOTE — Progress Notes (Unsigned)
Left without being seen

## 2023-02-07 ENCOUNTER — Encounter: Payer: BLUE CROSS/BLUE SHIELD | Attending: Family | Primary: Family Medicine

## 2023-11-22 ENCOUNTER — Encounter: Payer: BLUE CROSS/BLUE SHIELD | Attending: Obstetrics & Gynecology | Primary: Family Medicine
